# Patient Record
Sex: Male | Born: 1970 | Race: White | Hispanic: No | Marital: Married | Smoking: Never smoker
Health system: Southern US, Community
[De-identification: ages and names within clinical notes are randomized; demographics above are authoritative.]

## PROBLEM LIST (undated history)

## (undated) DIAGNOSIS — M1990 Unspecified osteoarthritis, unspecified site: Secondary | ICD-10-CM

## (undated) DIAGNOSIS — N201 Calculus of ureter: Secondary | ICD-10-CM

## (undated) DIAGNOSIS — G4733 Obstructive sleep apnea (adult) (pediatric): Secondary | ICD-10-CM

## (undated) DIAGNOSIS — N200 Calculus of kidney: Secondary | ICD-10-CM

## (undated) DIAGNOSIS — K219 Gastro-esophageal reflux disease without esophagitis: Secondary | ICD-10-CM

## (undated) DIAGNOSIS — R51 Headache: Secondary | ICD-10-CM

## (undated) DIAGNOSIS — Z8719 Personal history of other diseases of the digestive system: Secondary | ICD-10-CM

## (undated) HISTORY — PX: STRABISMUS SURGERY: SHX218

## (undated) HISTORY — DX: Calculus of kidney: N20.0

## (undated) HISTORY — DX: Gastro-esophageal reflux disease without esophagitis: K21.9

## (undated) HISTORY — DX: Unspecified osteoarthritis, unspecified site: M19.90

---

## 2002-03-10 ENCOUNTER — Emergency Department (HOSPITAL_COMMUNITY): Admission: EM | Admit: 2002-03-10 | Discharge: 2002-03-10 | Payer: Self-pay | Admitting: Oral Surgery

## 2003-08-16 ENCOUNTER — Ambulatory Visit (HOSPITAL_COMMUNITY): Admission: RE | Admit: 2003-08-16 | Discharge: 2003-08-16 | Payer: Self-pay | Admitting: Family Medicine

## 2008-04-14 ENCOUNTER — Ambulatory Visit (HOSPITAL_BASED_OUTPATIENT_CLINIC_OR_DEPARTMENT_OTHER): Admission: RE | Admit: 2008-04-14 | Discharge: 2008-04-14 | Payer: Self-pay | Admitting: Otolaryngology

## 2008-04-17 ENCOUNTER — Ambulatory Visit: Payer: Self-pay | Admitting: Internal Medicine

## 2008-11-30 ENCOUNTER — Emergency Department (HOSPITAL_COMMUNITY): Admission: EM | Admit: 2008-11-30 | Discharge: 2008-11-30 | Payer: Self-pay | Admitting: Emergency Medicine

## 2010-05-08 ENCOUNTER — Ambulatory Visit (INDEPENDENT_AMBULATORY_CARE_PROVIDER_SITE_OTHER): Payer: 59 | Admitting: Internal Medicine

## 2010-05-08 ENCOUNTER — Encounter: Payer: Self-pay | Admitting: Internal Medicine

## 2010-05-08 VITALS — BP 118/72 | HR 76 | Ht 70.0 in | Wt 240.0 lb

## 2010-05-08 DIAGNOSIS — R109 Unspecified abdominal pain: Secondary | ICD-10-CM

## 2010-05-08 DIAGNOSIS — R195 Other fecal abnormalities: Secondary | ICD-10-CM

## 2010-05-08 DIAGNOSIS — K219 Gastro-esophageal reflux disease without esophagitis: Secondary | ICD-10-CM | POA: Insufficient documentation

## 2010-05-08 DIAGNOSIS — R1310 Dysphagia, unspecified: Secondary | ICD-10-CM | POA: Insufficient documentation

## 2010-05-08 DIAGNOSIS — K920 Hematemesis: Secondary | ICD-10-CM | POA: Insufficient documentation

## 2010-05-08 DIAGNOSIS — R1010 Upper abdominal pain, unspecified: Secondary | ICD-10-CM | POA: Insufficient documentation

## 2010-05-08 NOTE — Assessment & Plan Note (Signed)
A repeat EGD this could be related to medication or perhaps noncompliance with medication. I don't think there is a strong indication for any type of endoscopic workup of the colon at this point.

## 2010-05-08 NOTE — Assessment & Plan Note (Signed)
Prior esophageal dilation about 10 years ago. He still has dysphagia about once a month. He has been noncompliant with PPI lately. We'll plan on upper endoscopy with possible soft tissue dilation. Risks benefits and indications explained.

## 2010-05-08 NOTE — Patient Instructions (Signed)
Your EGD is scheduled on 05/15/2010 at 10am Continue Nexium We will send for your records from [facility]  Upper GI Endoscopy Upper GI endoscopy means using a flexible scope to look at the esophagus, stomach and upper small bowel. This is done to make a diagnosis in people with heartburn, abdominal pain, or abnormal bleeding. Sometimes an endoscope is needed to remove foreign bodies or food that become stuck in the esophagus; it can also be used to take biopsy samples. For the best results, do not eat or drink for 8 hours before having your upper endoscopy.  To perform the endoscopy, you will probably be sedated and your throat will be numbed with a special spray. The endoscope is then slowly passed down your throat (this will not interfere with your breathing). An endoscopy exam takes 15-30 minutes to complete and there is no real pain. Patients rarely remember much about the procedure. The results of the test may take several days if a biopsy or other test is taken.  You may have a sore throat after an endoscopy exam. Serious complications are very rare. Stick to liquids and soft foods until your pain is better. You should not drive a car or operate any dangerous equipment for at least 24 hours after being sedated. SEEK IMMEDIATE MEDICAL CARE IF:  You have severe throat pain.   You have shortness of breath.   You have bleeding problems.   You have a fever.   You have difficulty recovering from your sedation.  Document Released: 01/26/2004 Document Re-Released: 03/14/2009 [facility] Patient Information 2011 [place], [place].

## 2010-05-08 NOTE — Progress Notes (Signed)
  Subjective:    Patient ID: Bruce Stevenson, male    DOB: 19-Apr-1970, 40 y.o.   MRN: [id number]  HPI 40 yo white married man with history of what sounds like GERD and esophageal dilation. He does not recall details and says his wife handles his medical affairs usually. 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 Was on Prilosec and then Nexium.lately for the past year he has used it only intermittently as reflux symptoms were less. Now with two episodes of intense nocturnal abdominal pain, awakens, initially unable to pass gas, defecate. Pain is upper for most part. Has had sweats, felt like he was going to pass out. Cold compresses on forehead helped. Eventually passed a loose stool, nauseated and took an ent-emetic his wife had. This was 3 weeks ago. Another similar episode last week. Vomited with some streaks of red blood. Has been mostly ok since but did have upper abdomnal discomfort. He went to an urgent care [facility]) was told they would call if anything abnormal. He describes dysphagia with solids and liquids about once a month, he waits and it passes. Sensation in suprasternal area. Thinks he has irritable bowel as he has some loose stools, especially in last year. 1 large cup of tea in AM but no other significant caffeine. Weight has been stable overall.   Review of Systems Allergies, dyspnea, insomnia All other ROS negative    Objective:   Physical Exam General: Well-developed, well-nourished and in no acute distress. Mildly obese Vitals: Reviewed and listed above Eyes:anicteric, PERRL Mouth and posterior pharynx: normal.  Neck: supple w/o thyromegaly or mass.  Lungs: clear. Heart: S1S2, no rubs, murmurs, gallops. Abdomen: soft, non-tender, no hepatosplenomegaly, hernia, or mass and BS+.  Lymphatics: no cervical or [place]  nodes. Extremities:  no edema Neuro: nonfocal. A&O x 3.  Psych: appropriate mood and  affect.        Assessment & Plan:

## 2010-05-08 NOTE — Assessment & Plan Note (Signed)
Minor hematemesis with second episode of upper abdominal pain and vomiting. EGD will help understand this. Sounds like he has GERD, this or stricture irritation may be the cause, question peptic ulcer disease.

## 2010-05-08 NOTE — Assessment & Plan Note (Signed)
2 episodes of this. We're assuming the lab workup at the urgent care was normal but I will get back to see. He needs an upper endoscopy to investigate. If that is unrevealing an abdominal ultrasound to look for gallstones could be needed versus CT scanning. Upper GI luminal etiologies, gallstones, pancreatic seemed like possible sources at this point.

## 2010-05-12 ENCOUNTER — Encounter: Payer: Self-pay | Admitting: Internal Medicine

## 2010-05-15 ENCOUNTER — Ambulatory Visit (AMBULATORY_SURGERY_CENTER): Payer: 59 | Admitting: Internal Medicine

## 2010-05-15 ENCOUNTER — Encounter: Payer: Self-pay | Admitting: Internal Medicine

## 2010-05-15 VITALS — BP 148/101 | HR 72 | Temp 98.0°F | Resp 16 | Ht 70.0 in | Wt 240.0 lb

## 2010-05-15 DIAGNOSIS — K219 Gastro-esophageal reflux disease without esophagitis: Secondary | ICD-10-CM

## 2010-05-15 DIAGNOSIS — K2970 Gastritis, unspecified, without bleeding: Secondary | ICD-10-CM

## 2010-05-15 DIAGNOSIS — K920 Hematemesis: Secondary | ICD-10-CM

## 2010-05-15 DIAGNOSIS — R109 Unspecified abdominal pain: Secondary | ICD-10-CM

## 2010-05-15 DIAGNOSIS — R1010 Upper abdominal pain, unspecified: Secondary | ICD-10-CM

## 2010-05-15 DIAGNOSIS — R1310 Dysphagia, unspecified: Secondary | ICD-10-CM

## 2010-05-15 DIAGNOSIS — K2990 Gastroduodenitis, unspecified, without bleeding: Secondary | ICD-10-CM

## 2010-05-15 HISTORY — PX: ESOPHAGOGASTRODUODENOSCOPY: SHX1529

## 2010-05-15 MED ORDER — SODIUM CHLORIDE 0.9 % IV SOLN: 500.0000 mL | INTRAVENOUS | Status: DC

## 2010-05-15 NOTE — Patient Instructions (Addendum)
[facility] and [employer] reviewed with patient and carepartner. Blue Discharge sheet signed by carepartner.  Gastitis handout given.  There was gastritis or inflammation of the stomach lining seen at endoscopy today. The esophagus looked ok but it sounds like you have GERD (acid reflux). Stay on the Nexium. I think you could stop the sucralfate (carafate) or stop when you finish current prescription. Call Dr. Marvell Fuller office to make a follow-up appointment for 3 months. We may be able to reduce dose of Nexium then. Iva Boop, MD, [facility]

## 2010-05-16 ENCOUNTER — Telehealth: Payer: Self-pay

## 2010-05-16 NOTE — Procedures (Signed)
NAME:  Bruce Stevenson, Bruce Stevenson                ACCOUNT NO.:  [id number]   MEDICAL RECORD NO.:  [id number]          PATIENT TYPE:  OUT   LOCATION:  SLEEP CENTER                 FACILITY:  [facility]   PHYSICIAN:  Clinton D. Maple Hudson, MD, FCCP, FACPDATE OF BIRTH:  11/04/69   DATE OF STUDY:  04/14/2008                            NOCTURNAL POLYSOMNOGRAM   REFERRING PHYSICIAN:  Zola Button T. Lazarus Salines, M.D.   INDICATION FOR STUDY:  Insomnia with sleep apnea.   EPWORTH SLEEPINESS SCORE:  Epworth sleepiness score 1/24, BMI 32.8,  weight 235 pounds, height 71 inches, and neck 16 inches.   HOME MEDICATION:  Charted and reviewed.   SLEEP ARCHITECTURE:  Total sleep time 343 minutes with sleep efficiency  91.2%.  Stage I was 8.7%, stage II 66.4%, stage III 15.6%, REM 9.3% of  total sleep time.  Sleep latency 6 minutes, REM latency 84 minutes,  awake after sleep onset 27 minutes, arousal index 29.5.  No bedtime  medication was taken.   RESPIRATORY DATA:  Apnea/hypopnea index (AHI) 3.7 per hour.  A total of  21 events meeting standard apnea/hypopnea criteria were scored including  3 obstructive apneas and 18 hypopneas.  Events were more common while  supine.  REM AHI 13.1 per hour.  An additional 124 respiratory events  related to arousals were counted.  These do not meet duration or  desaturation for criteria to be scored as full apneas or hypopneas but  recognized as causing sleep disturbance and gave a total respiratory  disturbance index (RDI) of 25.3 per hour.  There were insufficient  apneas and hypopneas to permit CPAP titration by split protocol on the  study night.   OXYGEN DATA:  Moderate snoring with oxygen desaturation to a nadir of  85%.  Mean oxygen saturation through the study was 94.6% on room air.   CARDIAC DATA:  Normal sinus rhythm.   MOVEMENT/PARASOMNIA:  No significant movement disturbance.  No bathroom  trips.   IMPRESSION/RECOMMENDATIONS:  1. Moderate obstructive sleep apnea/hypopnea  syndrome based on      respiratory disturbance index, 25.3 per hour.  This represented a      significant number of events not quite meeting duration and oxygen      desaturation criteria to be scored as apneas and hypopneas.  The      apnea/hypopnea index (AHI) was 3.7 per hour.  The RDI can be used      as a basis for initiating treatment as appropriate.  There was      moderate snoring with oxygen desaturation to a nadir of 85%.  2. Based on the RDI, consider return for CPAP titration if      appropriate.  The apnea/hypopnea index by itself did not provide      enough events to permit initiation of standard CPAP split protocol      titration on the study night.      Clinton D. Maple Hudson, MD, [facility], FACP  Diplomate, Biomedical engineer of Sleep Medicine  Electronically Signed     CDY/MEDQ  D:  04/17/2008 12:21:28  T:  04/18/2008 03:07:32  Job:  [id number]

## 2010-05-16 NOTE — Telephone Encounter (Signed)
I left message on the pt's home answering machine, ID on recording.  Pt to call if any questions or concerns.  MAW

## 2010-07-27 ENCOUNTER — Ambulatory Visit: Payer: 59 | Admitting: Internal Medicine

## 2010-11-28 ENCOUNTER — Telehealth: Payer: Self-pay | Admitting: Internal Medicine

## 2010-11-28 NOTE — Telephone Encounter (Signed)
I spoke with the patient's wife.  He has gone to the ER or urgent care.  He is c/o the above symptoms and the CP and SOB was worsening.  She will have him call back for an appt if the ER MD feels the symptoms are all GI related.

## 2010-11-28 NOTE — Telephone Encounter (Signed)
Agree - and will need ED records sent if not in epic

## 2012-09-09 ENCOUNTER — Ambulatory Visit (HOSPITAL_COMMUNITY)
Admission: RE | Admit: 2012-09-09 | Discharge: 2012-09-09 | Disposition: A | Discharge status: Home / Self Care | Payer: 59 | Source: Ambulatory Visit | Attending: Urology | Admitting: Urology

## 2012-09-09 ENCOUNTER — Ambulatory Visit (INDEPENDENT_AMBULATORY_CARE_PROVIDER_SITE_OTHER): Payer: 59 | Admitting: Urology

## 2012-09-09 ENCOUNTER — Other Ambulatory Visit: Payer: Self-pay | Admitting: Urology

## 2012-09-09 DIAGNOSIS — N201 Calculus of ureter: Secondary | ICD-10-CM | POA: Insufficient documentation

## 2012-09-09 DIAGNOSIS — N200 Calculus of kidney: Secondary | ICD-10-CM

## 2013-05-21 ENCOUNTER — Encounter (HOSPITAL_COMMUNITY): Payer: Self-pay | Admitting: Pharmacy Technician

## 2013-05-21 ENCOUNTER — Other Ambulatory Visit: Payer: Self-pay | Admitting: Neurological Surgery

## 2013-05-27 NOTE — Pre-Procedure Instructions (Signed)
Bruce Stevenson  05/27/2013   Your procedure is scheduled on:  Mon, June 1 @ 11:00 AM  Report to [facility]  at 8:00 AM.  Call this number if you have problems the morning of surgery: [phone]   Remember:   Do not eat food or drink liquids after midnight.   Take these medicines the morning of surgery with A SIP OF WATER: Esomeprazole(Nexium),Pain Pill(if needed),and Tamsulosin(Flomax)              Stop taking your Diclofenac and Ibuprofen. No Goody's,BC's,Aleve,Fish Oil,or any Herbal Medications   Do not wear jewelry  Do not wear lotions, powders, or colognes. You may wear deodorant.  Men may shave face and neck.  Do not bring valuables to the hospital.  [facility] is not responsible                  for any belongings or valuables.               Contacts, dentures or bridgework may not be worn into surgery.  Leave suitcase in the car. After surgery it may be brought to your room.  For patients admitted to the hospital, discharge time is determined by your                treatment team.               Patients discharged the day of surgery will not be allowed to drive  home.    Special Instructions:  [facility] - Preparing for Surgery  Before surgery, you can play an important role.  Because skin is not sterile, your skin needs to be as free of germs as possible.  You can reduce the number of germs on you skin by washing with CHG (chlorahexidine gluconate) soap before surgery.  CHG is an antiseptic cleaner which kills germs and bonds with the skin to continue killing germs even after washing.  Please DO NOT use if you have an allergy to CHG or antibacterial soaps.  If your skin becomes reddened/irritated stop using the CHG and inform your nurse when you arrive at Short Stay.  Do not shave (including legs and underarms) for at least 48 hours prior to the first CHG shower.  You may shave your face.  Please follow these instructions carefully:   1.  Shower with CHG Soap  the night before surgery and the                                morning of Surgery.  2.  If you choose to wash your hair, wash your hair first as usual with your       normal shampoo.  3.  After you shampoo, rinse your hair and body thoroughly to remove the                      Shampoo.  4.  Use CHG as you would any other liquid soap.  You can apply chg directly       to the skin and wash gently with scrungie or a clean washcloth.  5.  Apply the CHG Soap to your body ONLY FROM THE NECK DOWN.        Do not use on open wounds or open sores.  Avoid contact with your eyes,       ears, mouth and genitals (private parts).  Wash genitals (private parts)       with your normal soap.  6.  Wash thoroughly, paying special attention to the area where your surgery        will be performed.  7.  Thoroughly rinse your body with warm water from the neck down.  8.  DO NOT shower/wash with your normal soap after using and rinsing off       the CHG Soap.  9.  Pat yourself dry with a clean towel.            10.  Wear clean pajamas.            11.  Place clean sheets on your bed the night of your first shower and do not        sleep with pets.  Day of Surgery  Do not apply any lotions/deoderants the morning of surgery.  Please wear clean clothes to the hospital/surgery center.     Please read over the following fact sheets that you were given: Pain Booklet, Coughing and Deep Breathing, MRSA Information and Surgical Site Infection Prevention

## 2013-05-28 ENCOUNTER — Inpatient Hospital Stay (HOSPITAL_COMMUNITY)
Admission: RE | Admit: 2013-05-28 | Discharge: 2013-05-28 | Disposition: A | Discharge status: Home / Self Care | Payer: 59 | Source: Ambulatory Visit

## 2013-05-29 ENCOUNTER — Encounter (HOSPITAL_COMMUNITY)
Admission: RE | Admit: 2013-05-29 | Discharge: 2013-05-29 | Disposition: A | Discharge status: Home / Self Care | Payer: 59 | Source: Ambulatory Visit | Attending: Neurological Surgery | Admitting: Neurological Surgery

## 2013-05-29 ENCOUNTER — Encounter (HOSPITAL_COMMUNITY): Payer: Self-pay

## 2013-05-29 HISTORY — DX: Headache: R51

## 2013-05-29 LAB — CBC
HCT: 42.1 % (ref 39.0–52.0)
Hemoglobin: 15.1 g/dL (ref 13.0–17.0)
MCH: 30.5 pg (ref 26.0–34.0)
MCHC: 35.9 g/dL (ref 30.0–36.0)
MCV: 85.1 fL (ref 78.0–100.0)
PLATELETS: 261 10*3/uL (ref 150–400)
RBC: 4.95 MIL/uL (ref 4.22–5.81)
RDW: 13.1 % (ref 11.5–15.5)
WBC: 8.1 10*3/uL (ref 4.0–10.5)

## 2013-05-29 LAB — SURGICAL PCR SCREEN
MRSA, PCR: NEGATIVE
STAPHYLOCOCCUS AUREUS: POSITIVE — AB

## 2013-05-29 NOTE — Progress Notes (Signed)
Primary physician - does not have - uses walk in clinic  No  Cardiac testing

## 2013-05-31 MED ORDER — CEFAZOLIN SODIUM-DEXTROSE 2-3 GM-% IV SOLR
2.0000 g | INTRAVENOUS | Status: AC
Start: 2013-06-01 — End: 2013-06-01
  Administered 2013-06-01: 2 g via INTRAVENOUS
  Filled 2013-05-31: qty 50

## 2013-06-01 ENCOUNTER — Observation Stay (HOSPITAL_COMMUNITY)
Admission: RE | Admit: 2013-06-01 | Discharge: 2013-06-02 | Disposition: A | Discharge status: Home / Self Care | Payer: 59 | Source: Ambulatory Visit | Attending: Neurological Surgery | Admitting: Neurological Surgery

## 2013-06-01 ENCOUNTER — Encounter (HOSPITAL_COMMUNITY)
Admission: RE | Disposition: A | Discharge status: Home / Self Care | Payer: Self-pay | Source: Ambulatory Visit | Attending: Neurological Surgery

## 2013-06-01 ENCOUNTER — Encounter (HOSPITAL_COMMUNITY): Payer: Self-pay | Admitting: Anesthesiology

## 2013-06-01 ENCOUNTER — Inpatient Hospital Stay (HOSPITAL_COMMUNITY): Payer: 59 | Admitting: Anesthesiology

## 2013-06-01 ENCOUNTER — Encounter (HOSPITAL_COMMUNITY): Payer: 59 | Admitting: Anesthesiology

## 2013-06-01 ENCOUNTER — Inpatient Hospital Stay (HOSPITAL_COMMUNITY): Payer: 59

## 2013-06-01 DIAGNOSIS — G4730 Sleep apnea, unspecified: Secondary | ICD-10-CM | POA: Insufficient documentation

## 2013-06-01 DIAGNOSIS — F3289 Other specified depressive episodes: Secondary | ICD-10-CM | POA: Insufficient documentation

## 2013-06-01 DIAGNOSIS — M1990 Unspecified osteoarthritis, unspecified site: Secondary | ICD-10-CM | POA: Insufficient documentation

## 2013-06-01 DIAGNOSIS — Z01812 Encounter for preprocedural laboratory examination: Secondary | ICD-10-CM | POA: Insufficient documentation

## 2013-06-01 DIAGNOSIS — F411 Generalized anxiety disorder: Secondary | ICD-10-CM | POA: Insufficient documentation

## 2013-06-01 DIAGNOSIS — F329 Major depressive disorder, single episode, unspecified: Secondary | ICD-10-CM | POA: Insufficient documentation

## 2013-06-01 DIAGNOSIS — K219 Gastro-esophageal reflux disease without esophagitis: Secondary | ICD-10-CM | POA: Insufficient documentation

## 2013-06-01 DIAGNOSIS — D3610 Benign neoplasm of peripheral nerves and autonomic nervous system, unspecified: Secondary | ICD-10-CM | POA: Diagnosis present

## 2013-06-01 DIAGNOSIS — IMO0002 Reserved for concepts with insufficient information to code with codable children: Secondary | ICD-10-CM | POA: Insufficient documentation

## 2013-06-01 DIAGNOSIS — D321 Benign neoplasm of spinal meninges: Principal | ICD-10-CM | POA: Insufficient documentation

## 2013-06-01 HISTORY — PX: LAMINECTOMY: SHX219

## 2013-06-01 SURGERY — LUMBAR LAMINECTOMY FOR TUMOR
Anesthesia: General

## 2013-06-01 MED ORDER — PHENOL 1.4 % MT LIQD: 1.0000 | OROMUCOSAL | Status: DC | PRN

## 2013-06-01 MED ORDER — VECURONIUM BROMIDE 10 MG IV SOLR
INTRAVENOUS | Status: DC | PRN
  Administered 2013-06-01: 2 mg via INTRAVENOUS
  Administered 2013-06-01 (×3): 1 mg via INTRAVENOUS

## 2013-06-01 MED ORDER — METHOCARBAMOL 1000 MG/10ML IJ SOLN
500.0000 mg | Freq: Four times a day (QID) | INTRAMUSCULAR | Status: DC | PRN
  Filled 2013-06-01: qty 5

## 2013-06-01 MED ORDER — FENTANYL CITRATE 0.05 MG/ML IJ SOLN
INTRAMUSCULAR | Status: AC
  Filled 2013-06-01: qty 5

## 2013-06-01 MED ORDER — HYDROMORPHONE HCL PF 1 MG/ML IJ SOLN
0.2500 mg | INTRAMUSCULAR | Status: DC | PRN
  Administered 2013-06-01: 0.5 mg via INTRAVENOUS

## 2013-06-01 MED ORDER — MENTHOL 3 MG MT LOZG: 1.0000 | LOZENGE | OROMUCOSAL | Status: DC | PRN

## 2013-06-01 MED ORDER — OXYCODONE HCL 5 MG/5ML PO SOLN: 5.0000 mg | Freq: Once | ORAL | Status: DC | PRN

## 2013-06-01 MED ORDER — FENTANYL CITRATE 0.05 MG/ML IJ SOLN
INTRAMUSCULAR | Status: DC | PRN
  Administered 2013-06-01: 150 ug via INTRAVENOUS
  Administered 2013-06-01 (×2): 50 ug via INTRAVENOUS
  Administered 2013-06-01: 100 ug via INTRAVENOUS

## 2013-06-01 MED ORDER — SODIUM CHLORIDE 0.9 % IR SOLN
Status: DC | PRN
  Administered 2013-06-01: 12:00:00

## 2013-06-01 MED ORDER — POLYETHYLENE GLYCOL 3350 17 G PO PACK
17.0000 g | PACK | Freq: Every day | ORAL | Status: DC | PRN
  Filled 2013-06-01: qty 1

## 2013-06-01 MED ORDER — LACTATED RINGERS IV SOLN
INTRAVENOUS | Status: DC
  Administered 2013-06-01: 09:00:00 via INTRAVENOUS

## 2013-06-01 MED ORDER — NEOSTIGMINE METHYLSULFATE 10 MG/10ML IV SOLN
INTRAVENOUS | Status: AC
  Filled 2013-06-01: qty 2

## 2013-06-01 MED ORDER — PROPOFOL 10 MG/ML IV BOLUS
INTRAVENOUS | Status: AC
  Filled 2013-06-01: qty 20

## 2013-06-01 MED ORDER — SODIUM CHLORIDE 0.9 % IV SOLN: INTRAVENOUS | Status: DC

## 2013-06-01 MED ORDER — LIDOCAINE-EPINEPHRINE 1 %-1:100000 IJ SOLN
INTRAMUSCULAR | Status: DC | PRN
  Administered 2013-06-01: 5 mL

## 2013-06-01 MED ORDER — THROMBIN 20000 UNITS EX SOLR
CUTANEOUS | Status: DC | PRN
  Administered 2013-06-01: 12:00:00 via TOPICAL

## 2013-06-01 MED ORDER — GLYCOPYRROLATE 0.2 MG/ML IJ SOLN
INTRAMUSCULAR | Status: DC | PRN
  Administered 2013-06-01: .6 mg via INTRAVENOUS

## 2013-06-01 MED ORDER — METOCLOPRAMIDE HCL 5 MG/ML IJ SOLN
10.0000 mg | Freq: Once | INTRAMUSCULAR | Status: AC | PRN
  Administered 2013-06-01: 10 mg via INTRAVENOUS

## 2013-06-01 MED ORDER — DEXTROSE 5 % IV SOLN
10.0000 mg | INTRAVENOUS | Status: DC | PRN
  Administered 2013-06-01: 20 ug/min via INTRAVENOUS

## 2013-06-01 MED ORDER — ALUM & MAG HYDROXIDE-SIMETH 200-200-20 MG/5ML PO SUSP: 30.0000 mL | Freq: Four times a day (QID) | ORAL | Status: DC | PRN

## 2013-06-01 MED ORDER — LIDOCAINE HCL (CARDIAC) 20 MG/ML IV SOLN
INTRAVENOUS | Status: DC | PRN
  Administered 2013-06-01: 100 mg via INTRAVENOUS

## 2013-06-01 MED ORDER — ROCURONIUM BROMIDE 50 MG/5ML IV SOLN
INTRAVENOUS | Status: AC
  Filled 2013-06-01: qty 1

## 2013-06-01 MED ORDER — NEOSTIGMINE METHYLSULFATE 10 MG/10ML IV SOLN
INTRAVENOUS | Status: DC | PRN
  Administered 2013-06-01: 4 mg via INTRAVENOUS

## 2013-06-01 MED ORDER — 0.9 % SODIUM CHLORIDE (POUR BTL) OPTIME
TOPICAL | Status: DC | PRN
  Administered 2013-06-01: 1000 mL

## 2013-06-01 MED ORDER — LACTATED RINGERS IV SOLN
INTRAVENOUS | Status: DC | PRN
  Administered 2013-06-01 (×3): via INTRAVENOUS

## 2013-06-01 MED ORDER — ACETAMINOPHEN 325 MG PO TABS: 650.0000 mg | ORAL_TABLET | ORAL | Status: DC | PRN

## 2013-06-01 MED ORDER — MUPIROCIN 2 % EX OINT: 1.0000 "application " | TOPICAL_OINTMENT | Freq: Two times a day (BID) | CUTANEOUS | Status: DC

## 2013-06-01 MED ORDER — ARTIFICIAL TEARS OP OINT
TOPICAL_OINTMENT | OPHTHALMIC | Status: DC | PRN
  Administered 2013-06-01: 1 via OPHTHALMIC

## 2013-06-01 MED ORDER — ACETAMINOPHEN 650 MG RE SUPP: 650.0000 mg | RECTAL | Status: DC | PRN

## 2013-06-01 MED ORDER — TAMSULOSIN HCL 0.4 MG PO CAPS
0.4000 mg | ORAL_CAPSULE | Freq: Every day | ORAL | Status: DC
  Administered 2013-06-02: 0.4 mg via ORAL
  Filled 2013-06-01: qty 1

## 2013-06-01 MED ORDER — MIDAZOLAM HCL 5 MG/5ML IJ SOLN
INTRAMUSCULAR | Status: DC | PRN
  Administered 2013-06-01: 2 mg via INTRAVENOUS

## 2013-06-01 MED ORDER — ROCURONIUM BROMIDE 100 MG/10ML IV SOLN
INTRAVENOUS | Status: DC | PRN
  Administered 2013-06-01: 50 mg via INTRAVENOUS

## 2013-06-01 MED ORDER — ARTIFICIAL TEARS OP OINT
TOPICAL_OINTMENT | OPHTHALMIC | Status: AC
  Filled 2013-06-01: qty 3.5

## 2013-06-01 MED ORDER — SODIUM CHLORIDE 0.9 % IJ SOLN: 3.0000 mL | INTRAMUSCULAR | Status: DC | PRN

## 2013-06-01 MED ORDER — PANTOPRAZOLE SODIUM 40 MG PO TBEC
40.0000 mg | DELAYED_RELEASE_TABLET | Freq: Every day | ORAL | Status: DC
  Administered 2013-06-02: 40 mg via ORAL
  Filled 2013-06-01: qty 1

## 2013-06-01 MED ORDER — METHOCARBAMOL 500 MG PO TABS
ORAL_TABLET | ORAL | Status: AC
  Filled 2013-06-01: qty 1

## 2013-06-01 MED ORDER — BISACODYL 10 MG RE SUPP: 10.0000 mg | Freq: Every day | RECTAL | Status: DC | PRN

## 2013-06-01 MED ORDER — DEXAMETHASONE SODIUM PHOSPHATE 4 MG/ML IJ SOLN
INTRAMUSCULAR | Status: DC | PRN
  Administered 2013-06-01: 8 mg via INTRAVENOUS

## 2013-06-01 MED ORDER — PHENYLEPHRINE 40 MCG/ML (10ML) SYRINGE FOR IV PUSH (FOR BLOOD PRESSURE SUPPORT)
PREFILLED_SYRINGE | INTRAVENOUS | Status: AC
  Filled 2013-06-01: qty 10

## 2013-06-01 MED ORDER — METHOCARBAMOL 500 MG PO TABS
500.0000 mg | ORAL_TABLET | Freq: Four times a day (QID) | ORAL | Status: DC | PRN
  Administered 2013-06-01 (×2): 500 mg via ORAL
  Filled 2013-06-01: qty 1

## 2013-06-01 MED ORDER — MUPIROCIN 2 % EX OINT
1.0000 "application " | TOPICAL_OINTMENT | Freq: Two times a day (BID) | CUTANEOUS | Status: DC
  Administered 2013-06-01: 1 via NASAL

## 2013-06-01 MED ORDER — METOCLOPRAMIDE HCL 5 MG/ML IJ SOLN
INTRAMUSCULAR | Status: AC
  Filled 2013-06-01: qty 2

## 2013-06-01 MED ORDER — PHENYLEPHRINE HCL 10 MG/ML IJ SOLN
INTRAMUSCULAR | Status: DC | PRN
  Administered 2013-06-01 (×4): 40 ug via INTRAVENOUS

## 2013-06-01 MED ORDER — ONDANSETRON HCL 4 MG/2ML IJ SOLN: 4.0000 mg | INTRAMUSCULAR | Status: DC | PRN

## 2013-06-01 MED ORDER — DEXAMETHASONE SODIUM PHOSPHATE 4 MG/ML IJ SOLN
INTRAMUSCULAR | Status: AC
  Filled 2013-06-01: qty 2

## 2013-06-01 MED ORDER — OXYCODONE-ACETAMINOPHEN 5-325 MG PO TABS: 1.0000 | ORAL_TABLET | ORAL | Status: DC | PRN

## 2013-06-01 MED ORDER — SODIUM CHLORIDE 0.9 % IJ SOLN
3.0000 mL | Freq: Two times a day (BID) | INTRAMUSCULAR | Status: DC
  Administered 2013-06-01: 3 mL via INTRAVENOUS

## 2013-06-01 MED ORDER — ONDANSETRON HCL 4 MG/2ML IJ SOLN
INTRAMUSCULAR | Status: AC
  Filled 2013-06-01: qty 2

## 2013-06-01 MED ORDER — SODIUM CHLORIDE 0.9 % IV SOLN: 250.0000 mL | INTRAVENOUS | Status: DC

## 2013-06-01 MED ORDER — ONDANSETRON HCL 4 MG/2ML IJ SOLN
INTRAMUSCULAR | Status: DC | PRN
  Administered 2013-06-01: 4 mg via INTRAVENOUS

## 2013-06-01 MED ORDER — THROMBIN 5000 UNITS EX SOLR
OROMUCOSAL | Status: DC | PRN
  Administered 2013-06-01: 12:00:00 via TOPICAL

## 2013-06-01 MED ORDER — MIDAZOLAM HCL 2 MG/2ML IJ SOLN
INTRAMUSCULAR | Status: AC
  Filled 2013-06-01: qty 2

## 2013-06-01 MED ORDER — LIDOCAINE HCL (CARDIAC) 20 MG/ML IV SOLN
INTRAVENOUS | Status: AC
  Filled 2013-06-01: qty 5

## 2013-06-01 MED ORDER — HYDROMORPHONE HCL PF 1 MG/ML IJ SOLN
INTRAMUSCULAR | Status: AC
  Filled 2013-06-01: qty 1

## 2013-06-01 MED ORDER — OXYCODONE-ACETAMINOPHEN 5-325 MG PO TABS
1.0000 | ORAL_TABLET | ORAL | Status: DC | PRN
  Administered 2013-06-01 – 2013-06-02 (×2): 2 via ORAL
  Filled 2013-06-01 (×2): qty 2

## 2013-06-01 MED ORDER — SENNA 8.6 MG PO TABS
1.0000 | ORAL_TABLET | Freq: Two times a day (BID) | ORAL | Status: DC
  Administered 2013-06-01 – 2013-06-02 (×2): 8.6 mg via ORAL
  Filled 2013-06-01 (×3): qty 1

## 2013-06-01 MED ORDER — BUPIVACAINE HCL 0.5 % IJ SOLN
INTRAMUSCULAR | Status: DC | PRN
  Administered 2013-06-01: 5 mL
  Administered 2013-06-01: 20 mL

## 2013-06-01 MED ORDER — GLYCOPYRROLATE 0.2 MG/ML IJ SOLN
INTRAMUSCULAR | Status: AC
  Filled 2013-06-01: qty 4

## 2013-06-01 MED ORDER — DOCUSATE SODIUM 100 MG PO CAPS
100.0000 mg | ORAL_CAPSULE | Freq: Two times a day (BID) | ORAL | Status: DC
  Administered 2013-06-01 – 2013-06-02 (×2): 100 mg via ORAL
  Filled 2013-06-01 (×3): qty 1

## 2013-06-01 MED ORDER — MORPHINE SULFATE 2 MG/ML IJ SOLN: 1.0000 mg | INTRAMUSCULAR | Status: DC | PRN

## 2013-06-01 MED ORDER — OXYCODONE HCL 5 MG PO TABS: 5.0000 mg | ORAL_TABLET | Freq: Once | ORAL | Status: DC | PRN

## 2013-06-01 MED ORDER — VECURONIUM BROMIDE 10 MG IV SOLR
INTRAVENOUS | Status: AC
  Filled 2013-06-01: qty 10

## 2013-06-01 MED ORDER — PROPOFOL 10 MG/ML IV BOLUS
INTRAVENOUS | Status: DC | PRN
  Administered 2013-06-01: 200 mg via INTRAVENOUS

## 2013-06-01 SURGICAL SUPPLY — 81 items
ADH SKN CLS LQ APL DERMABOND (GAUZE/BANDAGES/DRESSINGS) ×1
APL SKNCLS STERI-STRIP NONHPOA (GAUZE/BANDAGES/DRESSINGS) ×1
BAG DECANTER FOR FLEXI CONT (MISCELLANEOUS) ×3 IMPLANT
BENZOIN TINCTURE PRP APPL 2/3 (GAUZE/BANDAGES/DRESSINGS) ×2 IMPLANT
BLADE 10 SAFETY STRL DISP (BLADE) ×1 IMPLANT
BLADE SURG 11 STRL SS (BLADE) ×4 IMPLANT
BLADE SURG ROTATE 9660 (MISCELLANEOUS) ×3 IMPLANT
BUR MATCHSTICK NEURO 3.0 LAGG (BURR) ×3 IMPLANT
CANISTER SUCT 3000ML (MISCELLANEOUS) ×3 IMPLANT
CATH ROBINSON RED A/P 10FR (CATHETERS) IMPLANT
CLIP TI MEDIUM 6 (CLIP) IMPLANT
CLOSURE WOUND 1/2 X4 (GAUZE/BANDAGES/DRESSINGS)
CONT SPEC 4OZ CLIKSEAL STRL BL (MISCELLANEOUS) ×5 IMPLANT
DECANTER SPIKE VIAL GLASS SM (MISCELLANEOUS) ×3 IMPLANT
DERMABOND ADHESIVE PROPEN (GAUZE/BANDAGES/DRESSINGS) ×2
DERMABOND ADVANCED .7 DNX6 (GAUZE/BANDAGES/DRESSINGS) IMPLANT
DRAPE LAPAROTOMY 100X72 PEDS (DRAPES) ×2 IMPLANT
DRAPE LAPAROTOMY 100X72X124 (DRAPES) ×3 IMPLANT
DRAPE LONG LASER MIC (DRAPES) IMPLANT
DRAPE MICROSCOPE LEICA (MISCELLANEOUS) ×2 IMPLANT
DRAPE POUCH INSTRU U-SHP 10X18 (DRAPES) ×3 IMPLANT
DRAPE PROXIMA HALF (DRAPES) ×4 IMPLANT
DURAPREP 26ML APPLICATOR (WOUND CARE) ×3 IMPLANT
ELECT REM PT RETURN 9FT ADLT (ELECTROSURGICAL) ×3
ELECTRODE REM PT RTRN 9FT ADLT (ELECTROSURGICAL) ×1 IMPLANT
GAUZE SPONGE 4X4 16PLY XRAY LF (GAUZE/BANDAGES/DRESSINGS) IMPLANT
GLOVE BIO SURGEON STRL SZ7.5 (GLOVE) IMPLANT
GLOVE BIOGEL PI IND STRL 7.0 (GLOVE) IMPLANT
GLOVE BIOGEL PI IND STRL 7.5 (GLOVE) IMPLANT
GLOVE BIOGEL PI IND STRL 8.5 (GLOVE) ×1 IMPLANT
GLOVE BIOGEL PI INDICATOR 7.0 (GLOVE) ×2
GLOVE BIOGEL PI INDICATOR 7.5 (GLOVE) ×4
GLOVE BIOGEL PI INDICATOR 8.5 (GLOVE) ×4
GLOVE ECLIPSE 8.0 STRL XLNG CF (GLOVE) ×8 IMPLANT
GLOVE ECLIPSE 8.5 STRL (GLOVE) ×7 IMPLANT
GLOVE EXAM NITRILE LRG STRL (GLOVE) IMPLANT
GLOVE EXAM NITRILE MD LF STRL (GLOVE) IMPLANT
GLOVE EXAM NITRILE XL STR (GLOVE) IMPLANT
GLOVE EXAM NITRILE XS STR PU (GLOVE) IMPLANT
GLOVE SURG SS PI 7.0 STRL IVOR (GLOVE) ×2 IMPLANT
GOWN STRL REUS W/ TWL LRG LVL3 (GOWN DISPOSABLE) IMPLANT
GOWN STRL REUS W/ TWL XL LVL3 (GOWN DISPOSABLE) ×1 IMPLANT
GOWN STRL REUS W/TWL 2XL LVL3 (GOWN DISPOSABLE) ×7 IMPLANT
GOWN STRL REUS W/TWL LRG LVL3 (GOWN DISPOSABLE)
GOWN STRL REUS W/TWL XL LVL3 (GOWN DISPOSABLE) ×6
HEMOSTAT SURGICEL 2X14 (HEMOSTASIS) ×3 IMPLANT
KIT BASIN OR (CUSTOM PROCEDURE TRAY) ×3 IMPLANT
KIT ROOM TURNOVER OR (KITS) ×3 IMPLANT
NDL SPNL 18GX3.5 QUINCKE PK (NEEDLE) IMPLANT
NEEDLE HYPO 22GX1.5 SAFETY (NEEDLE) ×3 IMPLANT
NEEDLE SPNL 18GX3.5 QUINCKE PK (NEEDLE) ×3 IMPLANT
NS IRRIG 1000ML POUR BTL (IV SOLUTION) ×3 IMPLANT
PACK LAMINECTOMY NEURO (CUSTOM PROCEDURE TRAY) IMPLANT
PAD ARMBOARD 7.5X6 YLW CONV (MISCELLANEOUS) ×9 IMPLANT
PATTIES SURGICAL .25X.25 (GAUZE/BANDAGES/DRESSINGS) IMPLANT
PATTIES SURGICAL .5 X1 (DISPOSABLE) ×2 IMPLANT
PATTIES SURGICAL .5 X3 (DISPOSABLE) IMPLANT
PATTIES SURGICAL 1/4 X 3 (GAUZE/BANDAGES/DRESSINGS) IMPLANT
RUBBERBAND STERILE (MISCELLANEOUS) IMPLANT
SPECIMEN JAR SMALL (MISCELLANEOUS) IMPLANT
SPONGE GAUZE 4X4 12PLY (GAUZE/BANDAGES/DRESSINGS) IMPLANT
SPONGE LAP 4X18 X RAY DECT (DISPOSABLE) IMPLANT
SPONGE SURGIFOAM ABS GEL 100 (HEMOSTASIS) ×3 IMPLANT
STAPLER SKIN PROX WIDE 3.9 (STAPLE) ×3 IMPLANT
STRIP CLOSURE SKIN 1/2X4 (GAUZE/BANDAGES/DRESSINGS) IMPLANT
SUT NURALON 4 0 TR CR/8 (SUTURE) ×2 IMPLANT
SUT PDS AB 1 CTX 36 (SUTURE) IMPLANT
SUT PROLENE 6 0 BV (SUTURE) ×8 IMPLANT
SUT SILK 3 0 TIES 17X18 (SUTURE)
SUT SILK 3-0 18XBRD TIE BLK (SUTURE) IMPLANT
SUT VIC AB 1 CT1 18XBRD ANBCTR (SUTURE) ×1 IMPLANT
SUT VIC AB 1 CT1 8-18 (SUTURE) ×6
SUT VIC AB 2-0 CP2 18 (SUTURE) ×5 IMPLANT
SUT VIC AB 3-0 SH 8-18 (SUTURE) ×2 IMPLANT
SYR 20ML ECCENTRIC (SYRINGE) ×3 IMPLANT
SYR CONTROL 10ML LL (SYRINGE) ×3 IMPLANT
TIP SONASTAR STD MISONIX 1.9 (TRAY / TRAY PROCEDURE) IMPLANT
TOWEL OR 17X24 6PK STRL BLUE (TOWEL DISPOSABLE) ×3 IMPLANT
TOWEL OR 17X26 10 PK STRL BLUE (TOWEL DISPOSABLE) ×3 IMPLANT
TRAY FOLEY CATH 16FRSI W/METER (SET/KITS/TRAYS/PACK) ×2 IMPLANT
WATER STERILE IRR 1000ML POUR (IV SOLUTION) ×3 IMPLANT

## 2013-06-01 NOTE — Anesthesia Preprocedure Evaluation (Addendum)
Anesthesia Evaluation  Patient identified by MRN, date of birth, ID band Patient awake    Reviewed: Allergy & Precautions, H&P , NPO status , Patient's Chart, lab work & pertinent test results, reviewed documented beta blocker date and time   Airway Mallampati: II TM Distance: >3 FB Neck ROM: full    Dental  (+) Dental Advisory Given, Teeth Intact   Pulmonary sleep apnea ,  breath sounds clear to auscultation        Cardiovascular negative cardio ROS  Rhythm:regular     Neuro/Psych  Headaches, PSYCHIATRIC DISORDERS Anxiety Depression  Neuromuscular disease negative psych ROS   GI/Hepatic Neg liver ROS, GERD-  Medicated and Controlled,  Endo/Other  Morbid obesity  Renal/GU Renal disease  negative genitourinary   Musculoskeletal  (+) Arthritis -, Osteoarthritis,    Abdominal   Peds  Hematology negative hematology ROS (+)   Anesthesia Other Findings See surgeon's H&P   Reproductive/Obstetrics negative OB ROS                         Anesthesia Physical Anesthesia Plan  ASA: III  Anesthesia Plan: General   Post-op Pain Management:    Induction: Intravenous  Airway Management Planned: Oral ETT  Additional Equipment:   Intra-op Plan:   Post-operative Plan: Extubation in OR  Informed Consent: I have reviewed the patients History and Physical, chart, labs and discussed the procedure including the risks, benefits and alternatives for the proposed anesthesia with the patient or authorized representative who has indicated his/her understanding and acceptance.   Dental Advisory Given  Plan Discussed with: CRNA and Surgeon  Anesthesia Plan Comments:         Anesthesia Quick Evaluation

## 2013-06-01 NOTE — Anesthesia Postprocedure Evaluation (Signed)
Anesthesia Post Note  Patient: Bruce Stevenson  Procedure(s) Performed: Procedure(s) (LRB): Lumbar Two to Three Laminectomy for resection of spinal tumor with operating microscope (N/A)  Anesthesia type: General  Patient location: PACU  Post pain: Pain level controlled  Post assessment: Patient's Cardiovascular Status Stable  Last Vitals:  Filed Vitals:   06/01/13 1445  BP: 93/42  Pulse: 65  Temp:   Resp: 10    Post vital signs: Reviewed and stable  Level of consciousness: alert  Complications: No apparent anesthesia complications

## 2013-06-01 NOTE — Anesthesia Procedure Notes (Signed)
Procedure Name: Intubation Date/Time: 06/01/2013 11:19 AM Performed by: Blair Heys E Pre-anesthesia Checklist: Patient identified, Emergency Drugs available, Suction available and Patient being monitored Patient Re-evaluated:Patient Re-evaluated prior to inductionOxygen Delivery Method: Circle system utilized Preoxygenation: Pre-oxygenation with 100% oxygen Intubation Type: IV induction Ventilation: Mask ventilation without difficulty and Oral airway inserted - appropriate to patient size Laryngoscope Size: Sabra Heck and 2 Grade View: Grade I Tube type: Oral Tube size: 7.5 mm Number of attempts: 1 Airway Equipment and Method: Stylet and Oral airway Placement Confirmation: ETT inserted through vocal cords under direct vision,  positive ETCO2 and breath sounds checked- equal and bilateral Secured at: 23 cm Tube secured with: Tape Dental Injury: Teeth and Oropharynx as per pre-operative assessment

## 2013-06-01 NOTE — Transfer of Care (Signed)
Immediate Anesthesia Transfer of Care Note  Patient: Bruce Stevenson Friday  Procedure(s) Performed: Procedure(s) with comments: Lumbar Two to Three Laminectomy for resection of spinal tumor with operating microscope (N/A) - L2-3 Laminectomy for resection of spinal tumor with operating microscope  Patient Location: PACU  Anesthesia Type:General  Level of Consciousness: awake and alert   Airway & Oxygen Therapy: Patient Spontanous Breathing and Patient connected to nasal cannula oxygen  Post-op Assessment: Report given to PACU RN, Post -op Vital signs reviewed and stable and Patient moving all extremities X 4  Post vital signs: Reviewed and stable  Complications: No apparent anesthesia complications

## 2013-06-01 NOTE — H&P (Signed)
CHIEF COMPLAINT:                                          Pain in the lower extremities and the back, cyst on MRI.  HISTORY OF PRESENT ILLNESS:                     Mr. Bruce Stevenson is a 43 year old right-handed individual who tells me that for about three months now, he has had progressive pain in his back with pain and weakness in his lower extremities.  It has been getting progressively worse and he sought some treatment with chiropractic and also at about the same time, he was seen by Dr. Lynann Bologna.  He was given a steroid dose course, which he feels may have helped some, but now that he is off the dose course, he feels that symptoms may be returning.  He finds that he gets pain in his back.  It gets worse when he walks any distance.  He has not been able to sleep well, and today Oswestry Disability Index suggest a disability index of 32.  He had an MRI performed that demonstrates there is a lesion in the spinal canal at about L3 that is isodense with spinal fluid.  It is suspected by the radiologist that this represents a CSF cyst; however, my suspicion is that the more common thing that this may represent is an ependymoma.  Nonetheless, there is a space occupying lesion, that does cause some pressure on the surrounding nerve roots in the region of the cauda equina.  The rest of the spine appears quite healthy with normal alignment of the spine.  Normal maintenance of all the vertebral body heights.  REVIEW OF SYSTEMS:                                    Systems review is notable for some balance disturbance, nausea, kidney stones, leg pain on walking, arm weakness, leg weakness, back pain, arm pain, leg pain, joint pain and swelling, arthritis, and neck pain.  He has some disorientation on occasion also.  PAST MEDICAL HISTORY:                                    Current Medical Conditions:  Reveals his general health has been excellent.    Medications and Allergies:  Current medications include only  Nexium.  SOCIAL HISTORY:                                            Personal history reveals that he does not smoke or use alcohol and has had about 20-pound weight gain since the pain began.  Currently states his height and weight are 5 feet 10 inches and 255 pounds.  PHYSICAL EXAMINATION:                                His physical exam reveals that he stands straight and erect.  He walks without any obvious antalgia.  He has good strength in the proximal musculature of his  lower extremities in the iliopsoas and quadriceps.  Tibialis anterior and gastrocs are also normal as noted by toe and heel walking.  Deep tendon reflexes are 2+ in the patellae, trace in both Achilles.  Sensation is intact to vibration distally in the lower extremities.  Specifically, the patient has not noted any problems with bowel or bladder control at this time also.  Mr. Ozga had an MRI with gadolinium that was performed on 05/18/2013.  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  Tumor measures 12 x 13 mm in dimension, is ovoid and thus compress to the outside of the exiting nerve roots in this region.  IMPRESSION/PLAN:                             I demonstrated and review the MRI findings with him and his wife who accompanies him.  I have advised that he does need to have this excised from his spine.  This would require a laminectomy at L2 and L3.  We would open the dura and identify the nerve roots and then dissect them free from the mass itself and remove the entirety of the mass.  I believe that this represents a benign process.  I also believe that it may very well be attached or arising from the filum terminale.  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  The other possibility is the cystic schwannoma that would likely be arising from one of the individual nerve roots.  This will be determine at this time of surgery, but basically the dissection  would be to remove the lesion in total and then close the dura.  This would leave a defect where he had a laminectomy, but should not be a cause for concern for longterm.  I noted to Leeroy that the surgery can take anywhere from two to four hours typically to dissect and remove and close this lesion.  The big risks of the surgery of course is the potential for damage to any of the individual nerve roots.  Beyond that, we worry about the potential for spinal fluid leak.  All these things will need to be managed possibly with surgery if need be for spinal fluid leak and occasionally with spinal fluid leaks, patients are treated with prolonged bed rest for five to seven days.  Barring any of the potential for complications, I would hope that Khairi would be in the hospital for at most two days' time.  His recovery would take about six weeks' time and I would plan this time out of work.  It is possible that if he is doing well after the first three weeks, we could allow him to do some office days if need be, but on the whole, I prefer that he remain out of the workplace for a total of six weeks time.  The main risks as noted was spinal fluid leakage, potential for injury to nerve roots and beyond this we do have some concern about infection.  Nonetheless this lesion does need to be removed as it is expand tile and can ultimately lead to some nerve damage.  At this point,. Montague has been having mostly irritated phenomenon in the form of pain and discomfort.  I would hope that these symptoms should resolve themselves once the lesion is resected and this process has had a chance to heal.  We would like to plan the surgery at his earliest convenience as he has been having symptoms for a number of months.  He has not had any involvement of bowel or bladder control, but he does note that moving his bowels does tend to engender some increasing pain. He is admitted for surgery.

## 2013-06-01 NOTE — Op Note (Signed)
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 endotracheal Indications: Patient is a 43 year old individual is had back and right lower extremity pain is also experiencing modest weakness and MRI was performed demonstrating what was suspected as an intradural lesion at the level of L2-L3 was initially thought this air present a synovial cyst, however after the gadolinium enhanced scan was performed it was evident that this was a solid tumor. The lesion measured proximally 14 mm in length 13 mm in diameter. He's been advised regarding the need for surgical resection.  Procedure: Patient was brought to the operating supine on a stretcher, after the smooth induction of general endotracheal anesthesia, he was turned prone onto the operating table. The back was prepped with alcohol and DuraPrep and draped in a sterile fashion. A midline incision was created at the level of L2-L3 which was marked on the skin was radiographic confirmation using a spinal needle. The dissection was carried into the subperiosteal space and the facet lamina to expose lamina of L2 completely including the junction of L2-L3. Then after a second confirmatory radiograph was obtained a laminectomy of the complete spinous process and laminar arch of L2 was performed being careful to maintain the integrity of the facet joints at the L2-L3 space and maintain also the integrity of the pars. The superior portion of the lamina of L3 was also undercut substantially as this area corresponded with the inferior margin of the lesion. Bleeding edges from the bone  were obtained with some Gelfoam soaked thrombin that was impacted into the bone epidural space was checked for hemostasis also. Once this was verified then the operating microscope was brought into the field and the dura was opened in the midline using a 15 blade initially to score the dura and then a [facility] with a 15 blade opened cephalad and caudad the underlying arachnoid was opened relieving substantial spinal fluid. Then dissection to the right of the nerve roots identified a purplish pearlescent lesion that measured approximately 14 mm as described on the MRI. This was dissected from the surrounding nerve roots fairly easily. As the lesion was lifted it was apparent that there was a singular root that was attached to the lesion. On the undersurface one can see the exophytic outgrowth of the lesion from this route. Under high-power magnification then I opened the pia layer surrounding the tumor and dissected a number of the nerve roots away however there was apparent evidence of several nerve roots going into the lesion itself these were sacrificed with micro-scissor. Bleeding from a vascular pedicle that went into the tumor was controlled by bipolar cautery and some small pledgets of Gelfoam. The lesion was then removed en bloc. Irrigation around this yielded good decompression no evidence of any residual tumor and hemostasis along the path of the nerve root. Nerve root was replaced back into the dural opening the contents of the dura were noted to lay loosely in the canal and then the dura was closed with 6-0 Prolene in a running fashion from the midline cephalad and from the midline distally. Valsalva maneuver to 45 cm water yielded no leakage. At this point the microscope was removed, the wound was irrigated copiously with antibiotic irrigant solution, hemostasis and the  soft tissues was checked and verified, and then the lumbar dorsal fascia was closed with #1 Vicryl in an interrupted  fashion.  2-0 Vicryl was used in the subcutaneous tissues 20 cc of half percent Marcaine was injected into the fascia and 3-0 Vicryl is used to close the subcutaneous take her skin. Blood loss for the procedure was estimated at approximately 150 cc.

## 2013-06-01 NOTE — Evaluation (Signed)
Occupational Therapy Evaluation Patient Details Name: Bruce Stevenson MRN: [id number] DOB: 1970/08/18 Today's Date: 06/01/2013    History of Present Illness 43 y.o. s/p Lumbar Two to Three Laminectomy for resection of spinal tumor with operating microscope (N/A)   Clinical Impression   Pt presents with below problem list. Pt independent with ADLs, PTA. Feel pt will benefit from acute OT to increase independence prior to d/c. Wife present for education.    Follow Up Recommendations  No OT follow up;Supervision - Intermittent    Equipment Recommendations  3 in 1 bedside comode;Other (comment) (AE)    Recommendations for Other Services       Precautions / Restrictions Precautions Precautions: Back Precaution Booklet Issued: Yes (comment) Precaution Comments: Educated on precautions  Restrictions Weight Bearing Restrictions: No      Mobility Bed Mobility Overal bed mobility: Needs Assistance Bed Mobility: Rolling;Sidelying to Sit;Sit to Sidelying Rolling: Min guard Sidelying to sit: Min guard     Sit to sidelying: Min guard General bed mobility comments: Cues for technique.  Transfers Overall transfer level: Needs assistance   Transfers: Sit to/from Stand Sit to Stand: Min guard;Min assist         General transfer comment: Cues for technique.    Balance                                            ADL Overall ADL's : Needs assistance/impaired     Grooming: Set up;Supervision/safety;Sitting               Lower Body Dressing: Minimal assistance;With adaptive equipment;Sit to/from stand   Toilet Transfer: Min guard;Ambulation (bed)           Functional mobility during ADLs: Min guard General ADL Comments: Educated on use of AE for LB ADLs as well as could perform in supine position. Pt practiced with sockaid. Pt ambulated a few steps in room two different times-limited by dizziness/nausea. Educated on toilet aid for hygiene as pt  states he has to twist quite a bit to do this.      Vision                     Perception     Praxis      Pertinent Vitals/Pain Pain indicated but not rated. Repositioned.      Hand Dominance     Extremity/Trunk Assessment Upper Extremity Assessment Upper Extremity Assessment: Overall WFL for tasks assessed   Lower Extremity Assessment Lower Extremity Assessment: Defer to PT evaluation       Communication Communication Communication: No difficulties   Cognition Arousal/Alertness: Awake/alert Behavior During Therapy: WFL for tasks assessed/performed Overall Cognitive Status: Within Functional Limits for tasks assessed                     General Comments       Exercises       Shoulder Instructions      Home Living Family/patient expects to be discharged to:: Private residence Living Arrangements: Spouse/significant other Available Help at Discharge: Family;Available PRN/intermittently Type of Home: House Home Access: Stairs to enter Entrance Stairs-Number of Steps: 3 Entrance Stairs-Rails: None Home Layout: One level     Bathroom Shower/Tub: Teacher, early years/pre: Standard     Home Equipment:  (access to shower chair)  Prior Functioning/Environment Level of Independence: Independent             OT Diagnosis: Acute pain   OT Problem List: Decreased activity tolerance;Decreased knowledge of use of DME or AE;Decreased knowledge of precautions;Pain   OT Treatment/Interventions: Self-care/ADL training;DME and/or AE instruction;Therapeutic activities;Patient/family education;Balance training    OT Goals(Current goals can be found in the care plan section) Acute Rehab OT Goals Patient Stated Goal: not stated OT Goal Formulation: With patient Time For Goal Achievement: 06/08/13 Potential to Achieve Goals: Good ADL Goals Pt Will Perform Lower Body Dressing: with modified independence;sit to/from stand;with  adaptive equipment Pt Will Transfer to Toilet: with modified independence;ambulating Pt Will Perform Toileting - Clothing Manipulation and hygiene: with modified independence;sit to/from stand Pt Will Perform Tub/Shower Transfer: Tub transfer;with supervision;ambulating;shower seat;rolling walker  OT Frequency: Min 2X/week   Barriers to D/C:            Co-evaluation              End of Session Equipment Utilized During Treatment: Gait belt Nurse Communication: Mobility status  Activity Tolerance: Other (comment) (dizziness, pain, nausea) Patient left: in bed;with call bell/phone within reach;with family/visitor present   Time: 3903-0092 OT Time Calculation (min): 20 min Charges:  OT General Charges $OT Visit: 1 Procedure OT Evaluation $Initial OT Evaluation Tier I: 1 Procedure OT Treatments $Self Care/Home Management : 8-22 mins G-Codes: OT G-codes **NOT FOR INPATIENT CLASS** Functional Assessment Tool Used: clinical judgment Functional Limitation: Self care Self Care Current Status (Z3007): At least 1 percent but less than 20 percent impaired, limited or restricted Self Care Goal Status (M2263): 0 percent impaired, limited or restricted  Benito Mccreedy OTR/L [phone] 06/01/2013, 6:37 PM

## 2013-06-02 ENCOUNTER — Encounter (HOSPITAL_COMMUNITY): Payer: Self-pay | Admitting: Neurological Surgery

## 2013-06-02 MED ORDER — DIAZEPAM 5 MG PO TABS: 5.0000 mg | ORAL_TABLET | Freq: Four times a day (QID) | ORAL | Status: DC | PRN

## 2013-06-02 MED ORDER — OXYCODONE-ACETAMINOPHEN 5-325 MG PO TABS: 1.0000 | ORAL_TABLET | ORAL | Status: DC | PRN

## 2013-06-02 NOTE — Discharge Instructions (Signed)
Wound Care Leave incision open to air. You may shower. Do not scrub directly on incision.  Do not put any creams, lotions, or ointments on incision. Activity Walk each and every day, increasing distance each day. No lifting greater than 5 lbs.  Avoid excessive neck motion. No driving for 2 weeks; may ride as a passenger locally. Wear neck brace at all times except when showering.  If provided soft collar, may wear for comfort unless otherwise instructed. Diet Resume your normal diet.  Return to Work Will be discussed at you follow up appointment. Call Your Doctor If Any of These Occur Redness, drainage, or swelling at the wound.  Temperature greater than 101 degrees. Severe pain not relieved by pain medication. Increased difficulty swallowing. Incision starts to come apart. Follow Up Appt Call today for appointment in 4 weeks ([phone]) or for problems.  If you have any hardware placed in your spine, you will need an x-ray before your appointment.     Laminectomy - Laminotomy - Discectomy Your surgeon has decided that a laminectomy (entire lamina removal) or laminotomy (partial lamina removal) is the best treatment for your back problem. These procedures involve removal of bone to relieve pressure on nerve roots. It allows the surgeon access to parts of the spine where other problems are located. This could be an injured disc (the cartilage-like structures located between the bones of the back). In this surgery your surgeon removes a part of the boney arch that surrounds your spinal canal. This may be compressing nerve roots. In some cases, the surgeon will remove the disc and fuse (stick together) vertebral bodies (the bones of your back) to make the spine more stable. The type of procedure you will need is usually decided prior to surgery, however modifications may be necessary. The time in surgery depends on the findings in surgery and the procedure necessary to correct the  problems. DISCECTOMY For people with disc problems, the surgeon removes the portion of the disc that is causing the pressure on the nerve root. Some surgeons perform a micro (small) discectomy, which may require removal of only a small portion of the lamina. A disc nucleus (center) may also be removed either through a needle (percutaneous discectomy) or by injecting an enzyme called chymopapain into the disc. Chymopapain is an enzyme that dissolves the disc. For people with back instability, the surgeon fuses vertebrae that are next to each other with tiny pieces of bone. These are used as bone grafts on the facets, or between the vertebrae. When this heals, the bones will no longer be able to move. These bone chips are often taken from the pelvic bones. Bones and bone grafts grow into one unit, stabilizing the segments of the spinal column. LET YOUR CAREGIVER KNOW ABOUT:  Allergies.  Medicines taken including herbs, eyedrops, over-the-counter medicines, and creams.  Use of steroids (by mouth or creams).  Previous problems with anesthetics or numbing medicine.  Possibility of pregnancy, if this applies.  History of blood clots (thrombophlebitis).  History of bleeding or blood problems.  Previous surgery.  Other health problems. RISKS AND COMPLICATIONS Your caregiver will discuss possible risks and complications with you before surgery. In addition to the usual risks of anesthesia, other common risks and complications include:  Blood loss and replacement.  Temporary increase in pain due to surgery.  Uncorrected back pain.  Infection.  New nerve damage (tingling, numbness, and pain). BEFORE THE PROCEDURE  Stop smoking at least 1 week prior to surgery. This  lowers risk during surgery.  Your caregiver may advise that you stop taking certain medicines that may affect the outcome of the surgery and your ability to heal. For example, you may need to stop taking anti-inflammatories,  such as aspirin, because of possible bleeding problems. Other medicines may have interactions with anesthesia.  Tell your caregiver if you have been on steroids for long periods of time. Often, additional steroids are administered intravenously before and during the procedure to prevent complications.  You should be present 60 minutes prior to your procedure or as directed. AFTER THE PROCEDURE After surgery, you will be taken to the recovery area where a nurse will watch and check your progress. Generally, you will be allowed to go home within 1 week barring other problems. HOME CARE INSTRUCTIONS   Check the surgical cut (incision) twice a day for signs of infection. Some signs may include a bad smelling, greenish or yellowish discharge from the wound; increased pain or increased redness over the incision site; an opening of the incision; flu-like symptoms; or a temperature above 101.5 F (38.6 C).  Change your bandages in about 24 to 36 hours following surgery or as directed.  You may shower once the bandage is removed or as directed. Avoid bathtubs, swimming pools, and hot tubs for 3 weeks or until your incision has healed completely. If you have stitches (sutures) or staples they may be removed 2 to 3 weeks after surgery, or as directed by your caregiver.  Follow your caregiver's instructions for activities, exercises, and physical therapy.  Weight reduction may be beneficial if you are overweight.  Walking is permitted. You may use a treadmill without an incline. Cut down on activities if you have discomfort. You may also go up and down stairs as tolerated.  Do not lift anything heavier than 10 to 15 pounds. Avoid bending or twisting at the waist. Always bend your knees.  Maintain strength and range of motion as instructed.  No driving is permitted for 2 to 3 weeks, or as directed by your caregiver. You may be a passenger for 20 to 30 minute trips. Lying back in the passenger seat may  be more comfortable for you.  Limit your sitting to 20 to 30 minute intervals. You should lie down or walk in between sitting periods. There are no limitations for sitting in a recliner chair.  Only take over-the-counter or prescription medicines for pain, discomfort, or fever as directed by your caregiver. SEEK MEDICAL CARE IF:   There is increased bleeding (more than a small spot) from the wound.  You notice redness, swelling, or increasing pain in the wound.  Pus is coming from the wound.  An unexplained oral temperature above 102 F (38.9 C) develops.  You notice a bad smell coming from the wound or dressing. SEEK IMMEDIATE MEDICAL CARE IF:   You develop a rash.  You have difficulty breathing.  You have any allergic problems. Document Released: 12/16/1999 Document Revised: 03/12/2011 Document Reviewed: 10/14/2007 [facility] Patient Information 2014 [place].

## 2013-06-02 NOTE — Discharge Summary (Signed)
Physician Discharge Summary  Patient ID: Bruce Stevenson MRN: [id number] DOB/AGE: February 25, 1970 43 y.o.  Admit date: 06/01/2013 Discharge date: 06/02/2013  Admission Diagnoses: Intradural schwannoma L2-L3 with lumbar radiculopathy  Discharge Diagnoses: Intradural schwannoma L2-L3 with lumbar radiculopathy Active Problems:   Schwannoma   Discharged Condition: good  Hospital Course: Patient was admitted to undergo surgical decompression of a schwannoma noted at L2-L3. The resection went uneventfully.  Consults: None  Significant Diagnostic Studies: None  Treatments: surgery: Laminectomy L2 partial L3 gross total resection of intradural schwannoma. Operating microscope  Discharge Exam: Blood pressure 120/67, pulse 99, temperature 97 F (36.1 C), temperature source Oral, resp. rate 18, weight 118.389 kg (261 lb), SpO2 93.00%. Incision is clean and dry, motor function is intact in lower extremities. Station and gait are normal.  Disposition: Discharge home  Discharge Instructions   Call MD for:  redness, tenderness, or signs of infection (pain, swelling, redness, odor or green/yellow discharge around incision site)    Complete by:  As directed      Call MD for:  severe uncontrolled pain    Complete by:  As directed      Call MD for:  temperature >100.4    Complete by:  As directed      Diet - low sodium heart healthy    Complete by:  As directed      Discharge instructions    Complete by:  As directed   Okay to shower. Do not apply salves or appointments to incision. No heavy lifting with the upper extremities greater than 15 pounds. May resume driving when not requiring pain medication and patient feels comfortable with doing so.     Increase activity slowly    Complete by:  As directed             Medication List         ADVIL 200 MG tablet  Generic drug:  ibuprofen  Take 400 mg by mouth daily as needed for mild pain.     diazepam 5 MG tablet  Commonly known as:  VALIUM   Take 1 tablet (5 mg total) by mouth every 6 (six) hours as needed for muscle spasms.     diclofenac sodium 1 % Gel  Commonly known as:  VOLTAREN  Apply 2 g topically at bedtime.     esomeprazole 40 MG capsule  Commonly known as:  NEXIUM  Take 40 mg by mouth daily before breakfast.     oxyCODONE-acetaminophen 5-325 MG per tablet  Commonly known as:  PERCOCET/ROXICET  Take 1-2 tablets by mouth every 4 (four) hours as needed for severe pain.     oxyCODONE-acetaminophen 5-325 MG per tablet  Commonly known as:  PERCOCET/ROXICET  Take 1-2 tablets by mouth every 4 (four) hours as needed for moderate pain.     tamsulosin 0.4 MG Caps capsule  Commonly known as:  FLOMAX  Take 0.4 mg by mouth daily.         SignedKristeen Miss 06/02/2013, 11:44 AM

## 2013-06-02 NOTE — Progress Notes (Signed)
Pt doing well. Pt and wife given D/C instructions with Rx's. Pt verbalized understanding of teaching. Pt's IV was removed prior to D/C. Pt D/C'd home via wheelchair @ 1220 per MD order. Pt is stable @ D/C and has no other needs at this time. Holli Humbles, RN

## 2013-06-02 NOTE — Evaluation (Signed)
Physical Therapy Evaluation/ Discharge Patient Details Name: Bruce Stevenson MRN: [id number] DOB: 06-20-1970 Today's Date: 06/02/2013   History of Present Illness  43 y.o. s/p Lumbar Two to Three Laminectomy for resection of spinal tumor   Clinical Impression  Pt moving well and unable to recall precautions from OT yesterday. Educated pt for all precautions and restrictions with handout and teach back , pt able to demonstrate correct technique with transfers and gait. Pt without bil LE weakness or pain and localized pain at incision only. Family able to assist as needed without DME needs or further therapy at this time. Pt verbalized understanding of all education and agreeable to sign off.    Follow Up Recommendations No PT follow up    Equipment Recommendations  None recommended by PT    Recommendations for Other Services       Precautions / Restrictions Precautions Precautions: Back Precaution Booklet Issued: Yes (comment) Precaution Comments: Educated on precautions  Restrictions Weight Bearing Restrictions: No      Mobility  Bed Mobility Overal bed mobility: Modified Independent     Sidelying to sit: Modified independent (Device/Increase time)     Sit to sidelying: Modified independent (Device/Increase time)    Transfers Overall transfer level: Modified independent                  Ambulation/Gait Ambulation/Gait assistance: Modified independent (Device/Increase time) Ambulation Distance (Feet): 350 Feet Assistive device: None Gait Pattern/deviations: WFL(Within Functional Limits)        Stairs Stairs: Yes Stairs assistance: Modified independent (Device/Increase time) Stair Management: No rails;Step to pattern;Forwards Number of Stairs: 3    Wheelchair Mobility    Modified Rankin (Stroke Patients Only)       Balance Overall balance assessment: No apparent balance deficits (not formally assessed)                                            Pertinent Vitals/Pain 6/10 incisional pain, RN aware    Home Living Family/patient expects to be discharged to:: Private residence Living Arrangements: Spouse/significant other Available Help at Discharge: Family;Available PRN/intermittently Type of Home: House Home Access: Stairs to enter Entrance Stairs-Rails: None Entrance Stairs-Number of Steps: 3 Home Layout: One level Home Equipment: None (access to shower chair)      Prior Function Level of Independence: Independent               Hand Dominance        Extremity/Trunk Assessment   Upper Extremity Assessment: Overall WFL for tasks assessed           Lower Extremity Assessment: Overall WFL for tasks assessed      Cervical / Trunk Assessment: Normal  Communication   Communication: No difficulties  Cognition Arousal/Alertness: Awake/alert Behavior During Therapy: WFL for tasks assessed/performed Overall Cognitive Status: Within Functional Limits for tasks assessed                      General Comments      Exercises        Assessment/Plan    PT Assessment Patent does not need any further PT services  PT Diagnosis     PT Problem List    PT Treatment Interventions     PT Goals (Current goals can be found in the Care Plan section) Acute Rehab PT Goals PT Goal Formulation: No  goals set, d/c therapy    Frequency     Barriers to discharge        Co-evaluation               End of Session   Activity Tolerance: Patient tolerated treatment well Patient left: in chair;with call bell/phone within reach Nurse Communication: Mobility status;Patient requests pain meds    Functional Assessment Tool Used: clinical judgement Functional Limitation: Mobility: Walking and moving around Mobility: Walking and Moving Around Current Status [phone]): At least 1 percent but less than 20 percent impaired, limited or restricted Mobility: Walking and Moving Around Goal Status  [phone]): At least 1 percent but less than 20 percent impaired, limited or restricted Mobility: Walking and Moving Around Discharge Status [phone]): At least 1 percent but less than 20 percent impaired, limited or restricted    Time: 0810-0830 PT Time Calculation (min): 20 min   Charges:   PT Evaluation $Initial PT Evaluation Tier I: 1 Procedure PT Treatments $Therapeutic Activity: 8-22 mins   PT G Codes:   Functional Assessment Tool Used: clinical judgement Functional Limitation: Mobility: Walking and moving around    [employer] 06/02/2013, 8:43 AM Elwyn Reach, [place]

## 2013-06-02 NOTE — Progress Notes (Signed)
Occupational Therapy Treatment Patient Details Name: Bruce Stevenson MRN: [id number] DOB: August 13, 1970 Today's Date: 06/02/2013    History of present illness 43 y.o. s/p Lumbar Two to Three Laminectomy for resection of spinal tumor    OT comments  Pt moving well and no further OT needs. Wife present during session.  Follow Up Recommendations  No OT follow up;Supervision - Intermittent    Equipment Recommendations   (AE)    Recommendations for Other Services      Precautions / Restrictions Precautions Precautions: Back Precaution Booklet Issued: Yes (comment) Precaution Comments: Pt able to state 3/3 precautions Restrictions Weight Bearing Restrictions: No       Mobility    General bed mobility comments: pt in chair  Transfers Overall transfer level: Needs assistance Equipment used: None Transfers: Sit to/from Stand Sit to Stand: Modified independent (Device/Increase time);Supervision         General transfer comment: supervision for toilet transfer.    Balance Overall balance assessment: No apparent balance deficits (not formally assessed)                                 ADL Overall ADL's : Needs assistance/impaired                     Lower Body Dressing: Set up;Supervision/safety;Sitting/lateral leans (sock)   Toilet Transfer: Supervision/safety;Regular Toilet;Grab bars       Tub/ Shower Transfer: Supervision/safety (practiced stepping over)   Functional mobility during ADLs: Modified independent;Supervision/safety (supervision for tub transfer.) General ADL Comments: Recommended spouse be with him for tub transfer. Educated on safe shoewear, toilet aid for hygiene, and discussed safety with rugs and pets. Pt practiced with reacher and sockaid. Educated on use of cups for teeth care and placement of grooming items to avoid bending/twisting.      Vision                     Perception     Praxis      Cognition    Behavior During Therapy: [facility] for tasks assessed/performed Overall Cognitive Status: Within Functional Limits for tasks assessed                       Extremity/Trunk Assessment  Upper Extremity Assessment Upper Extremity Assessment: Overall WFL for tasks assessed   Lower Extremity Assessment Lower Extremity Assessment: Overall WFL for tasks assessed   Cervical / Trunk Assessment Cervical / Trunk Assessment: Normal    Exercises     Shoulder Instructions       General Comments      Pertinent Vitals/ Pain       Pain 3/10 at end of session. Repositioned pillow.  Home Living Family/patient expects to be discharged to:: Private residence Living Arrangements: Spouse/significant other Available Help at Discharge: Family;Available PRN/intermittently Type of Home: House Home Access: Stairs to enter [employer] of Steps: 3 Entrance Stairs-Rails: None Home Layout: One level               Home Equipment: None (access to shower chair)          Prior Functioning/Environment Level of Independence: Independent            Frequency Min 2X/week     Progress Toward Goals  OT Goals(current goals can now be found in the care plan section)  Progress towards OT goals: Progressing toward goals  Acute  Rehab OT Goals Patient Stated Goal: not stated OT Goal Formulation: With patient Time For Goal Achievement: 06/08/13 Potential to Achieve Goals: Good ADL Goals Pt Will Perform Lower Body Dressing: with modified independence;sit to/from stand;with adaptive equipment Pt Will Transfer to Toilet: with modified independence;ambulating Pt Will Perform Toileting - Clothing Manipulation and hygiene: with modified independence;sit to/from stand Pt Will Perform Tub/Shower Transfer: Tub transfer;with supervision;ambulating;shower seat;rolling walker  Plan Discharge plan remains appropriate    Co-evaluation                 End of Session Equipment  Utilized During Treatment: Gait belt   Activity Tolerance Patient tolerated treatment well   Patient Left in chair;with family/visitor present   Nurse Communication Other (comment) (pain level)        Time: 7482-7078 OT Time Calculation (min): 15 min  Charges: OT General Charges $OT Visit: 1 Procedure OT Treatments $Self Care/Home Management : 8-22 mins  Benito Mccreedy OTR/L [phone] 06/02/2013, 10:07 AM

## 2013-06-02 NOTE — Progress Notes (Signed)
Patient ID: Bruce Stevenson, male   DOB: 01/01/1971, 43 y.o.   MRN: [id number] Alert vital signs stable. Incision is clean and dry Motor function is intact in iliopsoas quadriceps tibialis anterior and gastrocs Patient has voided no complaints of nausea pain under reasonable control Discharge home

## 2015-04-01 IMAGING — CR DG LUMBAR SPINE 2-3V
1 series · 1 of 1 positions shown · non-contrast
Comparison: MRI lumbar spine 05/18/2013.

CLINICAL DATA: Intradural spinal lesion.  Intraoperative film.

EXAM:
LUMBAR SPINE - 2-3 VIEW

[lat]
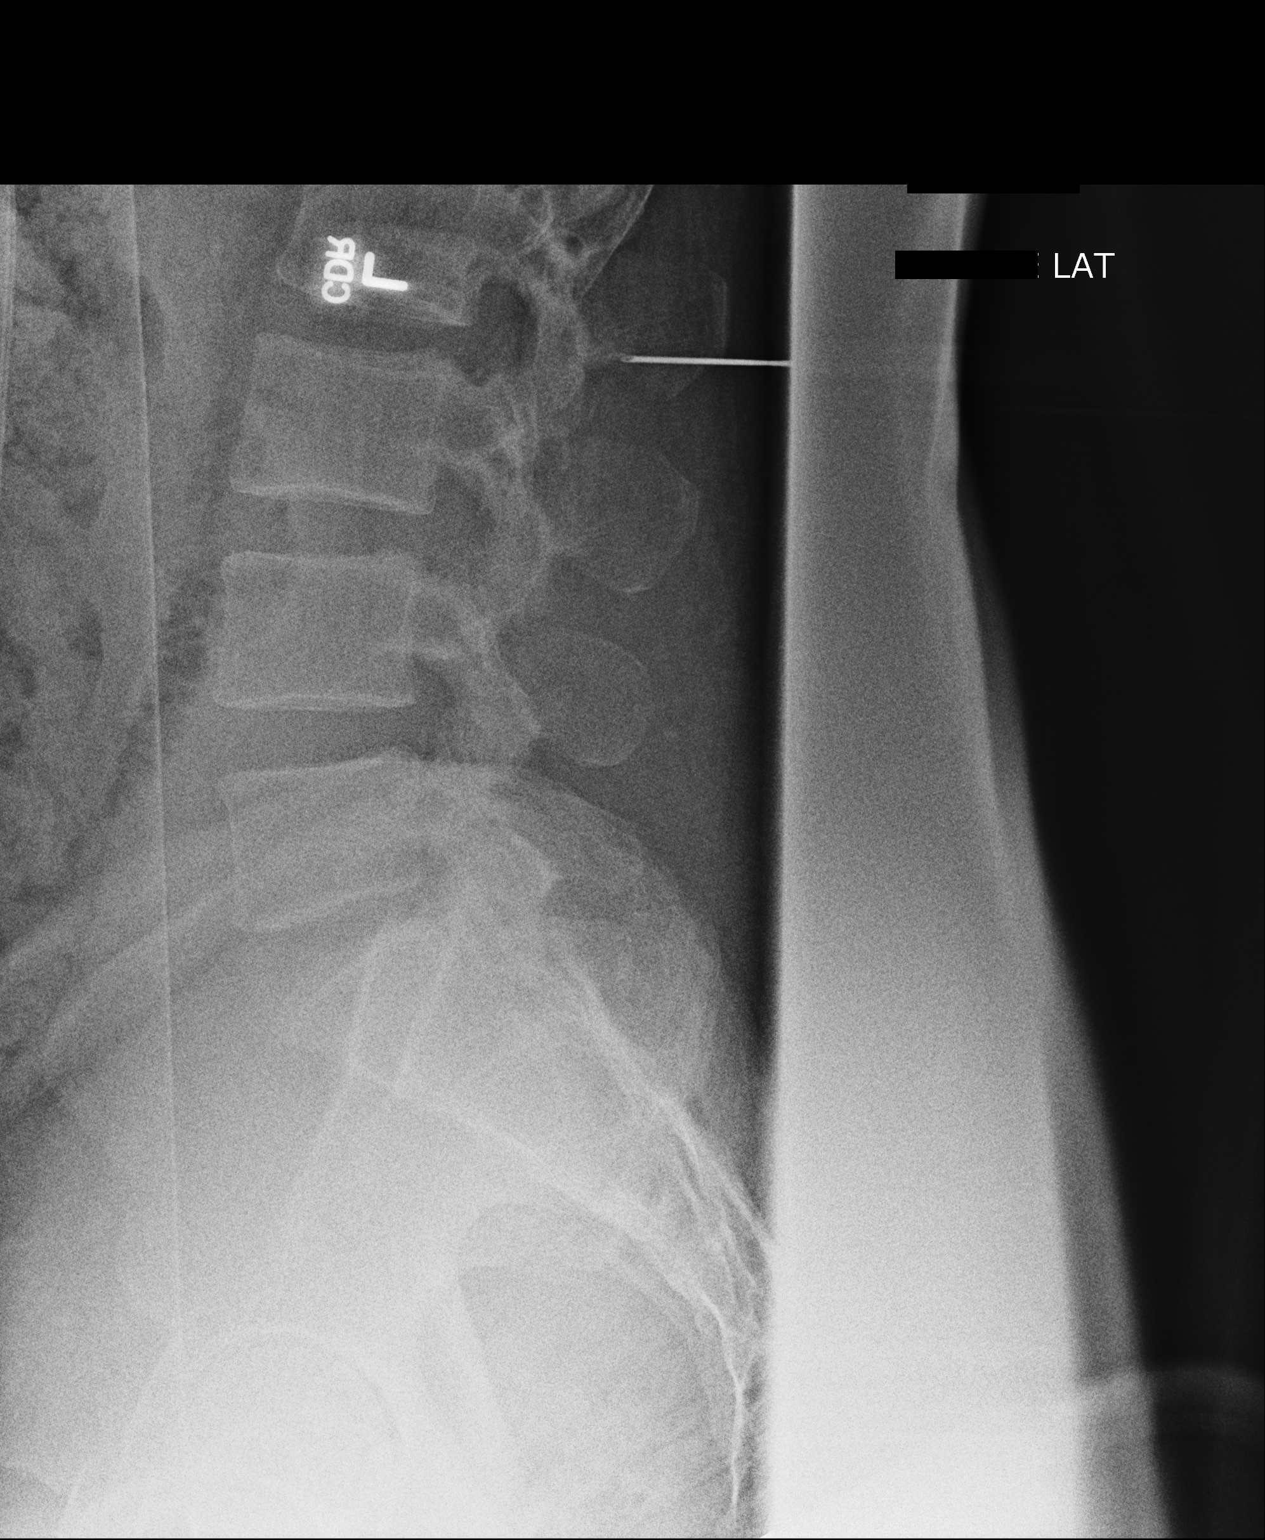

[1 of 1 positions shown; findings below may reference images not displayed]

FINDINGS: We are provided with 2 fluoroscopic intraoperative spot views of the
lumbar spine. The first image demonstrates a probe at the level of
the L2-3 interspace. The second image demonstrates probes at the
level of the L2 and L3 pedicles.
IMPRESSION: L2-3 localization.

## 2016-06-29 ENCOUNTER — Emergency Department (HOSPITAL_COMMUNITY)
Admission: EM | Admit: 2016-06-29 | Discharge: 2016-06-29 | Disposition: A | Discharge status: Home / Self Care | Payer: 59 | Attending: Emergency Medicine | Admitting: Emergency Medicine

## 2016-06-29 ENCOUNTER — Encounter (HOSPITAL_COMMUNITY): Payer: Self-pay | Admitting: Emergency Medicine

## 2016-06-29 ENCOUNTER — Emergency Department (HOSPITAL_COMMUNITY): Payer: 59

## 2016-06-29 DIAGNOSIS — Z79899 Other long term (current) drug therapy: Secondary | ICD-10-CM | POA: Insufficient documentation

## 2016-06-29 DIAGNOSIS — N201 Calculus of ureter: Secondary | ICD-10-CM | POA: Insufficient documentation

## 2016-06-29 DIAGNOSIS — R109 Unspecified abdominal pain: Secondary | ICD-10-CM | POA: Diagnosis present

## 2016-06-29 LAB — URINALYSIS, ROUTINE W REFLEX MICROSCOPIC
Bacteria, UA: NONE SEEN
Bilirubin Urine: NEGATIVE
Glucose, UA: NEGATIVE mg/dL
Ketones, ur: NEGATIVE mg/dL
Leukocytes, UA: NEGATIVE
NITRITE: NEGATIVE
Protein, ur: NEGATIVE mg/dL
SPECIFIC GRAVITY, URINE: 1.014 (ref 1.005–1.030)
Squamous Epithelial / LPF: NONE SEEN
pH: 5 (ref 5.0–8.0)

## 2016-06-29 LAB — CBC WITH DIFFERENTIAL/PLATELET
BASOS ABS: 0 10*3/uL (ref 0.0–0.1)
Basophils Relative: 0 %
Eosinophils Absolute: 0.2 10*3/uL (ref 0.0–0.7)
Eosinophils Relative: 1 %
HCT: 42.6 % (ref 39.0–52.0)
HEMOGLOBIN: 14.9 g/dL (ref 13.0–17.0)
Lymphocytes Relative: 21 %
Lymphs Abs: 2.5 10*3/uL (ref 0.7–4.0)
MCH: 29.4 pg (ref 26.0–34.0)
MCHC: 35 g/dL (ref 30.0–36.0)
MCV: 84.2 fL (ref 78.0–100.0)
MONO ABS: 1.3 10*3/uL — AB (ref 0.1–1.0)
Monocytes Relative: 11 %
Neutro Abs: 7.8 10*3/uL — ABNORMAL HIGH (ref 1.7–7.7)
Neutrophils Relative %: 67 %
Platelets: 327 10*3/uL (ref 150–400)
RBC: 5.06 MIL/uL (ref 4.22–5.81)
RDW: 12.6 % (ref 11.5–15.5)
WBC: 11.8 10*3/uL — AB (ref 4.0–10.5)

## 2016-06-29 LAB — BASIC METABOLIC PANEL
ANION GAP: 11 (ref 5–15)
BUN: 14 mg/dL (ref 6–20)
CHLORIDE: 101 mmol/L (ref 101–111)
CO2: 27 mmol/L (ref 22–32)
Calcium: 9.8 mg/dL (ref 8.9–10.3)
Creatinine, Ser: 1.72 mg/dL — ABNORMAL HIGH (ref 0.61–1.24)
GFR calc Af Amer: 53 mL/min — ABNORMAL LOW (ref >60)
GFR calc non Af Amer: 46 mL/min — ABNORMAL LOW (ref >60)
GLUCOSE: 93 mg/dL (ref 65–99)
POTASSIUM: 3.9 mmol/L (ref 3.5–5.1)
Sodium: 139 mmol/L (ref 135–145)

## 2016-06-29 MED ORDER — MAGNESIUM CITRATE PO SOLN: 1.0000 | Freq: Once | ORAL | 0 refills | Status: AC | PRN

## 2016-06-29 MED ORDER — ONDANSETRON HCL 4 MG/2ML IJ SOLN
4.0000 mg | Freq: Once | INTRAMUSCULAR | Status: AC
  Administered 2016-06-29: 4 mg via INTRAVENOUS
  Filled 2016-06-29: qty 2

## 2016-06-29 MED ORDER — HYDROMORPHONE HCL 1 MG/ML IJ SOLN
1.0000 mg | Freq: Once | INTRAMUSCULAR | Status: AC
  Administered 2016-06-29: 1 mg via INTRAVENOUS
  Filled 2016-06-29: qty 1

## 2016-06-29 MED ORDER — POLYETHYLENE GLYCOL 3350 17 GM/SCOOP PO POWD: 17.0000 g | Freq: Two times a day (BID) | ORAL | 0 refills | Status: AC

## 2016-06-29 MED ORDER — SODIUM CHLORIDE 0.9 % IV BOLUS (SEPSIS)
1000.0000 mL | Freq: Once | INTRAVENOUS | Status: AC
Start: 2016-06-29 — End: 2016-06-29
  Administered 2016-06-29: 1000 mL via INTRAVENOUS

## 2016-06-29 MED ORDER — OXYCODONE-ACETAMINOPHEN 5-325 MG PO TABS: 1.0000 | ORAL_TABLET | Freq: Four times a day (QID) | ORAL | 0 refills | Status: DC | PRN

## 2016-06-29 NOTE — ED Triage Notes (Signed)
Patient states that he saw a PA at [facility] and was told he had a 8 mm and wanted patient to pass it on his own. Patient is stating worse pain in his lower left flank. States history of kidney stones.

## 2016-06-29 NOTE — ED Notes (Signed)
Dr Viviana Simpler in to assess

## 2016-06-29 NOTE — ED Notes (Signed)
Patient transported to X-ray 

## 2016-06-29 NOTE — Discharge Instructions (Signed)
Your x-ray looks like you have some left sided kidney stones, the largest measuring 10 mm.  Please follow-up with your urologist on Monday. Do not eat or drink anything after midnight between Sunday to Monday in case they want to do a procedure.   Return without fail for worsening symptoms, including fever, intractable vomiting, escalating pain, or any other symptoms concerning to you. The best place to go is [facility], because the urologist has ability to perform procedures there if needed.

## 2016-06-29 NOTE — ED Provider Notes (Signed)
[facility] DEPT Provider Note   CSN: [id number] Arrival date & time: 06/29/16  1508     History   Chief Complaint Chief Complaint  Patient presents with  . Flank Pain    HPI LAZARIUS RIVKIN is a 46 y.o. male.  The history is provided by the patient.  Flank Pain  This is a new problem. The current episode started more than 1 week ago. The problem occurs constantly. The problem has been gradually worsening. Associated symptoms include abdominal pain. Pertinent negatives include no chest pain, no headaches and no shortness of breath. Nothing aggravates the symptoms. Nothing relieves the symptoms.   46 year old male who presents with left flank pain. History of recurrent nephrolithiasis. Reports 1 week of left-sided flank pain radiating to the left groin. I was seen by [facility] several days ago, and diagnosed with a 6-8 mm kidney stone. Was seen by a PA, and told to give it time to let it pass. Taking 2 Vicodin every 4-6 hours, with persistent worsening pain. Some intermittent nausea and vomiting. No fevers or chills. Mild dysuria. No significant hematuria or passage of blood clots. Past Medical History:  Diagnosis Date  . Anxiety   . Depression   . GERD (gastroesophageal reflux disease)   . Headache(784.0)   . Kidney stones   . Nephrolithiasis   . Osteoarthritis   . Sleep apnea    + sleep test, not on CPAP , he thinks nasal injury  the problem, has not followed up (Dr. Erik Obey - ENT_    Patient Active Problem List   Diagnosis Date Noted  . Schwannoma 06/01/2013  . Hematemesis 05/08/2010  . Dysphagia 05/08/2010  . Upper abdominal pain 05/08/2010  . Loose stools 05/08/2010  . GERD (gastroesophageal reflux disease) 05/08/2010    Past Surgical History:  Procedure Laterality Date  . ESOPHAGEAL DILATION     "esophageal narrowing", EGD approx 2002  . ESOPHAGOGASTRODUODENOSCOPY  05/15/2010   gastritis  . EYE SURGERY Left    muscular surgery  . LAMINECTOMY N/A  06/01/2013   Procedure: Lumbar Two to Three Laminectomy for resection of spinal tumor with operating microscope;  Surgeon: Kristeen Miss, MD;  Location: [place] [facility];  Service: Neurosurgery;  Laterality: N/A;  L2-3 Laminectomy for resection of spinal tumor with operating microscope       Home Medications    Prior to Admission medications   Medication Sig Start Date End Date Taking? Authorizing Provider  diazepam (VALIUM) 5 MG tablet Take 1 tablet (5 mg total) by mouth every 6 (six) hours as needed for muscle spasms. 06/02/13   Kristeen Miss, MD  diclofenac sodium (VOLTAREN) 1 % GEL Apply 2 g topically at bedtime.    [provider]  esomeprazole (NEXIUM) 40 MG capsule Take 40 mg by mouth daily before breakfast.     [provider]  ibuprofen (ADVIL) 200 MG tablet Take 400 mg by mouth daily as needed for mild pain.    [provider]  magnesium citrate SOLN Take 296 mLs (1 Bottle total) by mouth once as needed for severe constipation. 06/29/16   Forde Dandy, MD  oxyCODONE-acetaminophen (PERCOCET/ROXICET) 5-325 MG per tablet Take 1-2 tablets by mouth every 4 (four) hours as needed for severe pain.    [provider]  oxyCODONE-acetaminophen (PERCOCET/ROXICET) 5-325 MG per tablet Take 1-2 tablets by mouth every 4 (four) hours as needed for moderate pain. 06/02/13   Kristeen Miss, MD  oxyCODONE-acetaminophen (PERCOCET/ROXICET) 5-325 MG tablet Take 1-2 tablets  by mouth every 6 (six) hours as needed for moderate pain or severe pain. 06/29/16   Forde Dandy, MD  polyethylene glycol powder (GLYCOLAX/MIRALAX) powder Take 17 g by mouth 2 (two) times daily. 06/29/16   Forde Dandy, MD  tamsulosin (FLOMAX) 0.4 MG CAPS capsule Take 0.4 mg by mouth daily.    [provider]    Family History Family History  Problem Relation Age of Onset  . Ovarian cancer Mother   . Diabetes Mother   . Colon polyps Father 46    Social History Social History  Substance Use  Topics  . Smoking status: Never Smoker  . Smokeless tobacco: Never Used  . Alcohol use No     Allergies   Patient has no known allergies.   Review of Systems Review of Systems  Respiratory: Negative for shortness of breath.   Cardiovascular: Negative for chest pain.  Gastrointestinal: Positive for abdominal pain.  Genitourinary: Positive for flank pain.  Neurological: Negative for headaches.  All other systems reviewed and are negative.    Physical Exam Updated Vital Signs BP (!) 154/98   Pulse 82   Temp 98.1 F (36.7 C) (Oral)   Resp 18   Ht 5\' 10"  (1.778 m)   Wt 119.7 kg (264 lb)   SpO2 100%   BMI 37.88 kg/m   Physical Exam Physical Exam  Nursing note and vitals reviewed. Constitutional: Well developed, well nourished, non-toxic, and in no acute distress Head: Normocephalic and atraumatic.  Mouth/Throat: Oropharynx is clear and moist.  Neck: Normal range of motion. Neck supple.  Cardiovascular: Normal rate and regular rhythm.   Pulmonary/Chest: Effort normal and breath sounds normal.  Abdominal: Soft. There is no tenderness. There is no rebound and no guarding. Left CVA tenderness Musculoskeletal: Normal range of motion.  Neurological: Alert, no facial droop, fluent speech, moves all extremities symmetrically Skin: Skin is warm and dry.  Psychiatric: Cooperative   ED Treatments / Results  Labs (all labs ordered are listed, but only abnormal results are displayed) Labs Reviewed  URINALYSIS, ROUTINE W REFLEX MICROSCOPIC - Abnormal; Notable for the following:       Result Value   Hgb urine dipstick MODERATE (*)    All other components within normal limits  CBC WITH DIFFERENTIAL/PLATELET - Abnormal; Notable for the following:    WBC 11.8 (*)    Neutro Abs 7.8 (*)    Monocytes Absolute 1.3 (*)    All other components within normal limits  BASIC METABOLIC PANEL - Abnormal; Notable for the following:    Creatinine, Ser 1.72 (*)    GFR calc non Af Amer 46  (*)    GFR calc Af Amer 53 (*)    All other components within normal limits    EKG  EKG Interpretation None       Radiology Dg Abdomen 1 View  Result Date: 06/29/2016 CLINICAL DATA:  46 year old male with 1 week of progressive left flank pain. EXAM: ABDOMEN - 1 VIEW COMPARISON:  06/25/2016 [facility], and earlier. FINDINGS: A cluster of stones together measuring up to 20 mm projects at at the level of the proximal left ureter, at the L3-L4 level. These are slightly distal to their position on 06/25/2016 (L3 at that time). Numerous superimposed left nephrolithiasis which is otherwise grossly stable since 2016. Similar multiple calculi projecting over the right renal shadow today are partially obscured by proximal transverse colon. Stable bilateral pelvic phleboliths since 2016. Visualized bowel gas pattern is  non obstructed. No acute osseous abnormality identified. IMPRESSION: 1. Cluster of proximal left ureteral calculi, individually up to 10 mm, with minimal distal migration since 06/25/2016. Suspect associated left side obstructive uropathy. 2. Extensive bilateral nephrolithiasis. Electronically Signed   By: Genevie Ann M.D.   On: 06/29/2016 19:28    Procedures Procedures (including critical care time)  Medications Ordered in ED Medications  sodium chloride 0.9 % bolus 1,000 mL (1,000 mLs Intravenous New Bag/Given 06/29/16 1857)  HYDROmorphone (DILAUDID) injection 1 mg (1 mg Intravenous Given 06/29/16 1849)  ondansetron (ZOFRAN) injection 4 mg (4 mg Intravenous Given 06/29/16 1847)     Initial Impression / Assessment and Plan / ED Course  I have reviewed the triage vital signs and the nursing notes.  Pertinent labs & imaging results that were available during my care of the patient were reviewed by me and considered in my medical decision making (see chart for details).     Known left ureteral stone presents with worsening pain. He is nontoxic in no acute  distress, afebrile, hemodynamically stable. UA without signs of infection. X-ray does show probable multiple proximal ureteral stones on the left, with a maximum diameter of 10 mm. Pain improved with symptomatic management. With mild acute kidney injury with creatinine of 1.7, but unclear what his baseline is. Given clinical improvement he is felt to be stable for discharge with close urology follow-up on Monday. Did briefly discuss with Dr. Jeffie Pollock who recommended that patient be NPO at midnight on Sunday to anticipate procedure if needed on Monday. He recommended that he goes to [facility] ED if worsening symptoms. Strict return and follow-up instructions reviewed. He expressed understanding of all discharge instructions and felt comfortable with the plan of care.   Final Clinical Impressions(s) / ED Diagnoses   Final diagnoses:  Left ureteral stone    New Prescriptions New Prescriptions   MAGNESIUM CITRATE SOLN    Take 296 mLs (1 Bottle total) by mouth once as needed for severe constipation.   OXYCODONE-ACETAMINOPHEN (PERCOCET/ROXICET) 5-325 MG TABLET    Take 1-2 tablets by mouth every 6 (six) hours as needed for moderate pain or severe pain.   POLYETHYLENE GLYCOL POWDER (GLYCOLAX/MIRALAX) POWDER    Take 17 g by mouth 2 (two) times daily.     Forde Dandy, MD 06/29/16 2004

## 2016-07-02 ENCOUNTER — Other Ambulatory Visit: Payer: Self-pay | Admitting: Urology

## 2016-07-05 ENCOUNTER — Encounter (HOSPITAL_BASED_OUTPATIENT_CLINIC_OR_DEPARTMENT_OTHER): Payer: Self-pay | Admitting: *Deleted

## 2016-07-05 NOTE — Progress Notes (Signed)
NPO AFTER MN.  ARRIVE AT 1030.  NEEDS ISTAT 8.  WILL TAKE NEXIUM AND FLOMAX AM DOS W/ SIPS OF WATER.

## 2016-07-18 ENCOUNTER — Ambulatory Visit (HOSPITAL_BASED_OUTPATIENT_CLINIC_OR_DEPARTMENT_OTHER): Payer: 59 | Admitting: Anesthesiology

## 2016-07-18 ENCOUNTER — Encounter (HOSPITAL_BASED_OUTPATIENT_CLINIC_OR_DEPARTMENT_OTHER)
Admission: RE | Disposition: A | Discharge status: Home / Self Care | Payer: Self-pay | Source: Ambulatory Visit | Attending: Urology

## 2016-07-18 ENCOUNTER — Encounter (HOSPITAL_BASED_OUTPATIENT_CLINIC_OR_DEPARTMENT_OTHER): Payer: Self-pay | Admitting: Anesthesiology

## 2016-07-18 ENCOUNTER — Ambulatory Visit (HOSPITAL_BASED_OUTPATIENT_CLINIC_OR_DEPARTMENT_OTHER)
Admission: RE | Admit: 2016-07-18 | Discharge: 2016-07-18 | Disposition: A | Discharge status: Home / Self Care | Payer: 59 | Source: Ambulatory Visit | Attending: Urology | Admitting: Urology

## 2016-07-18 DIAGNOSIS — E669 Obesity, unspecified: Secondary | ICD-10-CM | POA: Insufficient documentation

## 2016-07-18 DIAGNOSIS — F419 Anxiety disorder, unspecified: Secondary | ICD-10-CM | POA: Insufficient documentation

## 2016-07-18 DIAGNOSIS — F329 Major depressive disorder, single episode, unspecified: Secondary | ICD-10-CM | POA: Insufficient documentation

## 2016-07-18 DIAGNOSIS — Z87442 Personal history of urinary calculi: Secondary | ICD-10-CM | POA: Diagnosis not present

## 2016-07-18 DIAGNOSIS — Z833 Family history of diabetes mellitus: Secondary | ICD-10-CM | POA: Insufficient documentation

## 2016-07-18 DIAGNOSIS — M1990 Unspecified osteoarthritis, unspecified site: Secondary | ICD-10-CM | POA: Diagnosis not present

## 2016-07-18 DIAGNOSIS — Z8371 Family history of colonic polyps: Secondary | ICD-10-CM | POA: Insufficient documentation

## 2016-07-18 DIAGNOSIS — N202 Calculus of kidney with calculus of ureter: Secondary | ICD-10-CM | POA: Diagnosis present

## 2016-07-18 DIAGNOSIS — Z8041 Family history of malignant neoplasm of ovary: Secondary | ICD-10-CM | POA: Diagnosis not present

## 2016-07-18 DIAGNOSIS — G4733 Obstructive sleep apnea (adult) (pediatric): Secondary | ICD-10-CM | POA: Insufficient documentation

## 2016-07-18 DIAGNOSIS — Z9889 Other specified postprocedural states: Secondary | ICD-10-CM | POA: Diagnosis not present

## 2016-07-18 DIAGNOSIS — K219 Gastro-esophageal reflux disease without esophagitis: Secondary | ICD-10-CM | POA: Insufficient documentation

## 2016-07-18 DIAGNOSIS — Z6837 Body mass index (BMI) 37.0-37.9, adult: Secondary | ICD-10-CM | POA: Diagnosis not present

## 2016-07-18 HISTORY — DX: Personal history of other diseases of the digestive system: Z87.19

## 2016-07-18 HISTORY — PX: HOLMIUM LASER APPLICATION: SHX5852

## 2016-07-18 HISTORY — DX: Obstructive sleep apnea (adult) (pediatric): G47.33

## 2016-07-18 HISTORY — PX: CYSTOSCOPY WITH RETROGRADE PYELOGRAM, URETEROSCOPY AND STENT PLACEMENT: SHX5789

## 2016-07-18 HISTORY — DX: Calculus of ureter: N20.1

## 2016-07-18 SURGERY — CYSTOURETEROSCOPY, WITH RETROGRADE PYELOGRAM AND STENT INSERTION
Anesthesia: General | Laterality: Bilateral

## 2016-07-18 MED ORDER — ACETAMINOPHEN 10 MG/ML IV SOLN
INTRAVENOUS | Status: AC
  Filled 2016-07-18: qty 100

## 2016-07-18 MED ORDER — METOCLOPRAMIDE HCL 5 MG/ML IJ SOLN
INTRAMUSCULAR | Status: AC
  Filled 2016-07-18: qty 2

## 2016-07-18 MED ORDER — GENTAMICIN SULFATE 40 MG/ML IJ SOLN
5.0000 mg/kg | Freq: Once | INTRAVENOUS | Status: DC
  Filled 2016-07-18: qty 15

## 2016-07-18 MED ORDER — KETOROLAC TROMETHAMINE 30 MG/ML IJ SOLN
INTRAMUSCULAR | Status: AC
  Filled 2016-07-18: qty 1

## 2016-07-18 MED ORDER — KETOROLAC TROMETHAMINE 10 MG PO TABS
10.0000 mg | ORAL_TABLET | Freq: Four times a day (QID) | ORAL | 1 refills | Status: AC | PRN
Start: 2016-07-18 — End: ?

## 2016-07-18 MED ORDER — FENTANYL CITRATE (PF) 100 MCG/2ML IJ SOLN
INTRAMUSCULAR | Status: AC
  Filled 2016-07-18: qty 2

## 2016-07-18 MED ORDER — ONDANSETRON HCL 4 MG/2ML IJ SOLN
INTRAMUSCULAR | Status: AC
  Filled 2016-07-18: qty 2

## 2016-07-18 MED ORDER — CEPHALEXIN 500 MG PO CAPS: 500.0000 mg | ORAL_CAPSULE | Freq: Two times a day (BID) | ORAL | 0 refills | Status: AC

## 2016-07-18 MED ORDER — HYDROMORPHONE HCL 1 MG/ML IJ SOLN
0.2500 mg | INTRAMUSCULAR | Status: DC | PRN
  Filled 2016-07-18: qty 0.5

## 2016-07-18 MED ORDER — OXYCODONE-ACETAMINOPHEN 5-325 MG PO TABS: 1.0000 | ORAL_TABLET | Freq: Four times a day (QID) | ORAL | 0 refills | Status: AC | PRN

## 2016-07-18 MED ORDER — MIDAZOLAM HCL 2 MG/2ML IJ SOLN
INTRAMUSCULAR | Status: AC
  Filled 2016-07-18: qty 2

## 2016-07-18 MED ORDER — FENTANYL CITRATE (PF) 100 MCG/2ML IJ SOLN
INTRAMUSCULAR | Status: DC | PRN
  Administered 2016-07-18 (×4): 25 ug via INTRAVENOUS
  Administered 2016-07-18: 12.5 ug via INTRAVENOUS
  Administered 2016-07-18 (×2): 25 ug via INTRAVENOUS
  Administered 2016-07-18: 12.5 ug via INTRAVENOUS
  Administered 2016-07-18: 25 ug via INTRAVENOUS

## 2016-07-18 MED ORDER — MIDAZOLAM HCL 5 MG/5ML IJ SOLN
INTRAMUSCULAR | Status: DC | PRN
  Administered 2016-07-18: 2 mg via INTRAVENOUS

## 2016-07-18 MED ORDER — LIDOCAINE 2% (20 MG/ML) 5 ML SYRINGE
INTRAMUSCULAR | Status: AC
  Filled 2016-07-18: qty 5

## 2016-07-18 MED ORDER — PROPOFOL 10 MG/ML IV BOLUS
INTRAVENOUS | Status: AC
  Filled 2016-07-18: qty 20

## 2016-07-18 MED ORDER — METOCLOPRAMIDE HCL 5 MG/ML IJ SOLN
INTRAMUSCULAR | Status: DC | PRN
  Administered 2016-07-18: 10 mg via INTRAVENOUS

## 2016-07-18 MED ORDER — PROPOFOL 10 MG/ML IV BOLUS
INTRAVENOUS | Status: DC | PRN
  Administered 2016-07-18: 50 mg via INTRAVENOUS
  Administered 2016-07-18: 200 mg via INTRAVENOUS

## 2016-07-18 MED ORDER — MEPERIDINE HCL 25 MG/ML IJ SOLN
6.2500 mg | INTRAMUSCULAR | Status: DC | PRN
  Filled 2016-07-18: qty 1

## 2016-07-18 MED ORDER — KETOROLAC TROMETHAMINE 30 MG/ML IJ SOLN
INTRAMUSCULAR | Status: DC | PRN
  Administered 2016-07-18: 30 mg via INTRAVENOUS

## 2016-07-18 MED ORDER — ACETAMINOPHEN 10 MG/ML IV SOLN
INTRAVENOUS | Status: DC | PRN
  Administered 2016-07-18: 1000 mg via INTRAVENOUS

## 2016-07-18 MED ORDER — LACTATED RINGERS IV SOLN
INTRAVENOUS | Status: DC
  Administered 2016-07-18 (×3): via INTRAVENOUS
  Filled 2016-07-18: qty 1000

## 2016-07-18 MED ORDER — LIDOCAINE 2% (20 MG/ML) 5 ML SYRINGE
INTRAMUSCULAR | Status: DC | PRN
  Administered 2016-07-18: 100 mg via INTRAVENOUS

## 2016-07-18 MED ORDER — ONDANSETRON HCL 4 MG/2ML IJ SOLN
INTRAMUSCULAR | Status: DC | PRN
  Administered 2016-07-18: 4 mg via INTRAVENOUS

## 2016-07-18 MED ORDER — GENTAMICIN SULFATE 40 MG/ML IJ SOLN
5.0000 mg/kg | Freq: Once | INTRAVENOUS | Status: AC
  Administered 2016-07-18: 460 mg via INTRAVENOUS
  Filled 2016-07-18: qty 11.5

## 2016-07-18 MED ORDER — DEXAMETHASONE SODIUM PHOSPHATE 10 MG/ML IJ SOLN
INTRAMUSCULAR | Status: AC
  Filled 2016-07-18: qty 1

## 2016-07-18 MED ORDER — SENNOSIDES-DOCUSATE SODIUM 8.6-50 MG PO TABS: 1.0000 | ORAL_TABLET | Freq: Two times a day (BID) | ORAL | 0 refills | Status: AC

## 2016-07-18 MED ORDER — ONDANSETRON HCL 4 MG/2ML IJ SOLN
4.0000 mg | Freq: Once | INTRAMUSCULAR | Status: DC | PRN
Start: 2016-07-18 — End: 2016-07-18
  Filled 2016-07-18: qty 2

## 2016-07-18 MED ORDER — DEXAMETHASONE SODIUM PHOSPHATE 4 MG/ML IJ SOLN
INTRAMUSCULAR | Status: DC | PRN
  Administered 2016-07-18: 10 mg via INTRAVENOUS

## 2016-07-18 MED ORDER — IOHEXOL 300 MG/ML  SOLN
INTRAMUSCULAR | Status: DC | PRN
  Administered 2016-07-18: 20 mL

## 2016-07-18 SURGICAL SUPPLY — 33 items
BAG DRAIN URO-CYSTO SKYTR STRL (DRAIN) ×3 IMPLANT
BAG DRN UROCATH (DRAIN) ×1
BASKET LASER NITINOL 1.9FR (BASKET) ×5 IMPLANT
BSKT STON RTRVL 120 1.9FR (BASKET) ×2
CATH INTERMIT  6FR 70CM (CATHETERS) IMPLANT
CLOTH BEACON ORANGE TIMEOUT ST (SAFETY) ×4 IMPLANT
FIBER LASER FLEXIVA 365 (UROLOGICAL SUPPLIES) IMPLANT
FIBER LASER TRAC TIP (UROLOGICAL SUPPLIES) ×2 IMPLANT
GLOVE BIO SURGEON STRL SZ 6.5 (GLOVE) ×1 IMPLANT
GLOVE BIO SURGEON STRL SZ7.5 (GLOVE) ×3 IMPLANT
GLOVE BIO SURGEONS STRL SZ 6.5 (GLOVE) ×1
GLOVE BIOGEL PI IND STRL 6.5 (GLOVE) IMPLANT
GLOVE BIOGEL PI IND STRL 7.5 (GLOVE) IMPLANT
GLOVE BIOGEL PI INDICATOR 6.5 (GLOVE) ×2
GLOVE BIOGEL PI INDICATOR 7.5 (GLOVE) ×4
GOWN STRL REUS W/TWL LRG LVL3 (GOWN DISPOSABLE) ×7 IMPLANT
GOWN STRL REUS W/TWL XL LVL3 (GOWN DISPOSABLE) IMPLANT
GUIDEWIRE ANG ZIPWIRE 038X150 (WIRE) ×5 IMPLANT
GUIDEWIRE STR DUAL SENSOR (WIRE) ×5 IMPLANT
INFUSOR MANOMETER BAG 3000ML (MISCELLANEOUS) ×5 IMPLANT
IV NS 1000ML (IV SOLUTION) ×3
IV NS 1000ML BAXH (IV SOLUTION) ×1 IMPLANT
IV NS IRRIG 3000ML ARTHROMATIC (IV SOLUTION) ×5 IMPLANT
KIT RM TURNOVER CYSTO AR (KITS) ×3 IMPLANT
MANIFOLD NEPTUNE II (INSTRUMENTS) ×2 IMPLANT
NS IRRIG 1000ML POUR BTL (IV SOLUTION) ×2 IMPLANT
NS IRRIG 500ML POUR BTL (IV SOLUTION) ×3 IMPLANT
PACK CYSTO (CUSTOM PROCEDURE TRAY) ×3 IMPLANT
STENT POLARIS 5FRX26 (STENTS) ×4 IMPLANT
SYRINGE 10CC LL (SYRINGE) ×3 IMPLANT
TUBE CONNECTING 12'X1/4 (SUCTIONS)
TUBE CONNECTING 12X1/4 (SUCTIONS) IMPLANT
TUBE FEEDING 8FR 16IN STR KANG (MISCELLANEOUS) ×3 IMPLANT

## 2016-07-18 NOTE — H&P (Signed)
Bruce Stevenson is an 46 y.o. male.    Chief Complaint: Pre-op 1st stage BILATERAL Ureteroscopic Stone Manipulation  HPI:   1 - Recurrent Nephrolithiasis -  Pre 2018 SWL, URS, MET x many  07/2016 - left 1cm + mid ureteral steinstrasse + bilateral renal stone by KUB.   PMH sig for benign back surgery (no deficits), obesity. NO CV disease / blood thinners. No PCP at present.   Today "Bruce Stevenson" is seen to proceed with 1st stage bilateral ureteroscopic stone manipulation. Most recent UCX negative. No interval fevers.    Past Medical History:  Diagnosis Date  . Anxiety   . Depression   . GERD (gastroesophageal reflux disease)   . Headache(784.0)   . History of gastritis   . Left ureteral stone   . Nephrolithiasis    bilateral  . OSA (obstructive sleep apnea)    moderate OSA per study 04-14-2008-- pt does use cpap  . Osteoarthritis     Past Surgical History:  Procedure Laterality Date  . ESOPHAGOGASTRODUODENOSCOPY  05/15/2010  . LAMINECTOMY N/A 06/01/2013   Procedure: Lumbar Two to Three Laminectomy for resection of spinal tumor with operating microscope;  Surgeon: Kristeen Miss, MD;  Location: [place] [facility];  Service: Neurosurgery;  Laterality: N/A;  L2-3 Laminectomy for resection of spinal tumor with operating microscope  . STRABISMUS SURGERY Left age 48    Family History  Problem Relation Age of Onset  . Ovarian cancer Mother   . Diabetes Mother   . Colon polyps Father 76   Social History:  reports that he has never smoked. He has never used smokeless tobacco. He reports that he does not drink alcohol or use drugs.  Allergies: No Known Allergies  No prescriptions prior to admission.    No results found for this or any previous visit (from the past 48 hour(s)). No results found.  Review of Systems  Constitutional: Negative.   HENT: Negative.   Eyes: Negative.   Respiratory: Negative.   Cardiovascular: Negative.   Gastrointestinal: Negative.   Genitourinary: Positive  for flank pain.  Skin: Negative.   Neurological: Negative.   Endo/Heme/Allergies: Negative.   Psychiatric/Behavioral: Negative.     Height _0  (1.778 m), weight 119.7 kg (264 lb). Physical Exam  Constitutional: He appears well-developed.  HENT:  Head: Normocephalic.  Eyes: Pupils are equal, round, and reactive to light.  Neck: Normal range of motion.  Cardiovascular: Normal rate.   Respiratory: Effort normal.  GI: Soft.  Genitourinary:  Genitourinary Comments: Mild LEFT CVAT  Musculoskeletal: Normal range of motion.  Neurological: He is alert.  Skin: Skin is warm.  Psychiatric: He has a normal mood and affect. His behavior is normal.     Assessment/Plan  1 - Recurrent Nephrolithiasis - proceed as planned with 1st stage BILATERAL ureteroscopic stone manipulation. Focus today left ureteral stone, then moreso bilateral renal stones next stage with interval bilateral stents. Risks, benefits, alternatives, expected peri-op course and need for staged approach discussed previously and reiterated today.    Alexis Frock, MD 07/18/2016, 6:15 AM

## 2016-07-18 NOTE — Transfer of Care (Signed)
Immediate Anesthesia Transfer of Care Note  Patient: Bruce Stevenson  Procedure(s) Performed: Procedure(s) (LRB): 1ST STAGE -CYSTOSCOPY WITH RETROGRADE PYELOGRAM, URETEROSCOPY AND STENT PLACEMENT (Bilateral) HOLMIUM LASER APPLICATION (Bilateral)  Patient Location: PACU  Anesthesia Type: General  Level of Consciousness: awake, sedated, patient cooperative and responds to stimulation  Airway & Oxygen Therapy: Patient Spontanous Breathing and Patient connected to [place] O2  Post-op Assessment: Report given to PACU RN, Post -op Vital signs reviewed and stable and Patient moving all extremities  Post vital signs: Reviewed and stable  Complications: No apparent anesthesia complications

## 2016-07-18 NOTE — Anesthesia Postprocedure Evaluation (Signed)
Anesthesia Post Note  Patient: Bruce Stevenson  Procedure(s) Performed: Procedure(s) (LRB): 1ST STAGE -CYSTOSCOPY WITH RETROGRADE PYELOGRAM, URETEROSCOPY AND STENT PLACEMENT (Bilateral) HOLMIUM LASER APPLICATION (Bilateral)     Patient location during evaluation: PACU Anesthesia Type: General Level of consciousness: awake and alert Pain management: pain level controlled Vital Signs Assessment: post-procedure vital signs reviewed and stable Respiratory status: spontaneous breathing, nonlabored ventilation, respiratory function stable and patient connected to nasal cannula oxygen Cardiovascular status: blood pressure returned to baseline and stable Postop Assessment: no signs of nausea or vomiting Anesthetic complications: no    Last Vitals:  Vitals:   07/18/16 1330 07/18/16 1345  BP: 135/79 124/74  Pulse: 82 76  Resp: 13 11  Temp:      Last Pain:  Vitals:   07/18/16 1324  TempSrc:   PainSc: [place] DAVID

## 2016-07-18 NOTE — Interval H&P Note (Signed)
History and Physical Interval Note:  07/18/2016 11:26 AM  Bruce Stevenson  has presented today for surgery, with the diagnosis of BILATERAL RENAL AND LEFT URETERAL STONE  The various methods of treatment have been discussed with the patient and family. After consideration of risks, benefits and other options for treatment, the patient has consented to  Procedure(s): 1ST STAGE -CYSTOSCOPY WITH RETROGRADE PYELOGRAM, URETEROSCOPY AND STENT PLACEMENT (Bilateral) HOLMIUM LASER APPLICATION (Bilateral) as a surgical intervention .  The patient's history has been reviewed, patient examined, no change in status, stable for surgery.  I have reviewed the patient's chart and labs.  Questions were answered to the patient's satisfaction.     [name]

## 2016-07-18 NOTE — Anesthesia Preprocedure Evaluation (Signed)
Anesthesia Evaluation  Patient identified by MRN, date of birth, ID band Patient awake    Reviewed: Allergy & Precautions, NPO status , Patient's Chart, lab work & pertinent test results  Airway Mallampati: I  TM Distance: >3 FB Neck ROM: Full    Dental   Pulmonary sleep apnea ,    Pulmonary exam normal        Cardiovascular Normal cardiovascular exam     Neuro/Psych    GI/Hepatic   Endo/Other    Renal/GU Renal InsufficiencyRenal disease     Musculoskeletal   Abdominal   Peds  Hematology   Anesthesia Other Findings   Reproductive/Obstetrics                             Anesthesia Physical Anesthesia Plan  ASA: III  Anesthesia Plan: General   Post-op Pain Management:    Induction: Intravenous  PONV Risk Score and Plan: 2 and Ondansetron and Treatment may vary due to age or medical condition  Airway Management Planned: LMA  Additional Equipment:   Intra-op Plan:   Post-operative Plan: Extubation in OR  Informed Consent: I have reviewed the patients History and Physical, chart, labs and discussed the procedure including the risks, benefits and alternatives for the proposed anesthesia with the patient or authorized representative who has indicated his/her understanding and acceptance.     Plan Discussed with: CRNA and Surgeon  Anesthesia Plan Comments:         Anesthesia Quick Evaluation

## 2016-07-18 NOTE — Interval H&P Note (Signed)
History and Physical Interval Note:  07/18/2016 11:26 AM  Bruce Stevenson  has presented today for surgery, with the diagnosis of BILATERAL RENAL AND LEFT URETERAL STONE  The various methods of treatment have been discussed with the patient and family. After consideration of risks, benefits and other options for treatment, the patient has consented to  Procedure(s): 1ST STAGE -CYSTOSCOPY WITH RETROGRADE PYELOGRAM, URETEROSCOPY AND STENT PLACEMENT (Bilateral) HOLMIUM LASER APPLICATION (Bilateral) as a surgical intervention .  The patient's history has been reviewed, patient examined, no change in status, stable for surgery.  I have reviewed the patient's chart and labs.  Questions were answered to the patient's satisfaction.     [name]

## 2016-07-18 NOTE — Discharge Instructions (Signed)
Post Anesthesia Home Care Instructions  Activity: Get plenty of rest for the remainder of the day. A responsible individual must stay with you for 24 hours following the procedure.  For the next 24 hours, DO NOT: -Drive a car -Operate machinery -Drink alcoholic beverages -Take any medication unless instructed by your physician -Make any legal decisions or sign important papers.  Meals: Start with liquid foods such as gelatin or soup. Progress to regular foods as tolerated. Avoid greasy, spicy, heavy foods. If nausea and/or vomiting occur, drink only clear liquids until the nausea and/or vomiting subsides. Call your physician if vomiting continues.  Special Instructions/Symptoms: Your throat may feel dry or sore from the anesthesia or the breathing tube placed in your throat during surgery. If this causes discomfort, gargle with warm salt water. The discomfort should disappear within 24 hours.  If you had a scopolamine patch placed behind your ear for the management of post- operative nausea and/or vomiting:  1. The medication in the patch is effective for 72 hours, after which it should be removed.  Wrap patch in a tissue and discard in the trash. Wash hands thoroughly with soap and water. 2. You may remove the patch earlier than 72 hours if you experience unpleasant side effects which may include dry mouth, dizziness or visual disturbances. 3. Avoid touching the patch. Wash your hands with soap and water after contact with the patch.      [facility] [phone] Post Ureteroscopy With or Without Stent Instructions  Definitions:  Ureter: The duct that transports urine from the kidney to the bladder. Stent:   A plastic hollow tube that is placed into the ureter, from the kidney to the bladder to prevent the ureter from swelling shut.  GENERAL INSTRUCTIONS:  Despite the fact that no skin incisions were used, the area around the ureter and bladder is raw and  irritated. The stent is a foreign body which will further irritate the bladder wall. This irritation is manifested by increased frequency of urination, both day and night, and by an increase in the urge to urinate. In some, the urge to urinate is present almost always. Sometimes the urge is strong enough that you may not be able to stop yourself from urinating. The only real cure is to remove the stent and then give time for the bladder wall to heal which can't be done until the danger of the ureter swelling shut has passed, which varies.  You may see some blood in your urine while the stent is in place and a few days afterwards. Do not be alarmed, even if the urine was clear for a while. Get off your feet and drink lots of fluids until clearing occurs. If you start to pass clots or don't improve, call us.  DIET: You may return to your normal diet immediately. Because of the raw surface of your bladder, alcohol, spicy foods, acid type foods and drinks with caffeine may cause irritation or frequency and should be used in moderation. To keep your urine flowing freely and to avoid constipation, drink plenty of fluids during the day ( 8-10 glasses ). Tip: Avoid cranberry juice because it is very acidic.  ACTIVITY: Your physical activity doesn't need to be restricted. However, if you are very active, you may see some blood in your urine. We suggest that you reduce your activity under these circumstances until the bleeding has stopped.  BOWELS: It is important to keep your bowels regular during the postoperative period. Straining   It is important to keep your bowels regular during the postoperative period. Straining with bowel movements can cause bleeding. A bowel movement every other day is reasonable. Use a mild laxative if needed, such as Milk of Magnesia 2-3 tablespoons, or 2 Dulcolax tablets. Call if you continue to have problems. If you have been taking narcotics for pain, before, during or after your surgery, you may be constipated. Take a laxative if  necessary. ° ° °MEDICATION: °You should resume your pre-surgery medications unless told not to. In addition you will often be given an antibiotic to prevent infection. These should be taken as prescribed until the bottles are finished unless you are having an unusual reaction to one of the drugs. ° °PROBLEMS YOU SHOULD REPORT TO US: °· Fevers over 100.5 Fahrenheit. °· Heavy bleeding, or clots ( See above notes about blood in urine ). °· Inability to urinate. °· Drug reactions ( hives, rash, nausea, vomiting, diarrhea ). °· Severe burning or pain with urination that is not improving. ° °FOLLOW-UP: °You will need a follow-up appointment to monitor your progress. Call for this appointment at the number listed above. Usually the first appointment will be about three to fourteen days after your surgery. ° ° ° ° °1 - You may have urinary urgency (bladder spasms) and bloody urine on / off with stent in place. This is normal. ° °2 - Call MD or go to ER for fever >102, severe pain / nausea / vomiting not relieved by medications, or acute change in medical status ° °

## 2016-07-18 NOTE — Anesthesia Procedure Notes (Signed)
Procedure Name: LMA Insertion Date/Time: 07/18/2016 11:54 AM Performed by: Justice Rocher Pre-anesthesia Checklist: Patient identified, Emergency Drugs available, Suction available and Patient being monitored Patient Re-evaluated:Patient Re-evaluated prior to induction Oxygen Delivery Method: Circle system utilized Preoxygenation: Pre-oxygenation with 100% oxygen Induction Type: IV induction Ventilation: Mask ventilation without difficulty LMA: LMA inserted LMA Size: 5.0 Number of attempts: 1 Airway Equipment and Method: Bite block Placement Confirmation: positive ETCO2 and breath sounds checked- equal and bilateral Tube secured with: Tape Dental Injury: Teeth and Oropharynx as per pre-operative assessment

## 2016-07-18 NOTE — Brief Op Note (Signed)
07/18/2016  1:10 PM  PATIENT:  Bruce Stevenson  46 y.o. male  PRE-OPERATIVE DIAGNOSIS:  BILATERAL RENAL AND LEFT URETERAL STONE  POST-OPERATIVE DIAGNOSIS:  BILATERAL RENAL AND LEFT URETERAL STONE  PROCEDURE:  Procedure(s): 1ST STAGE -CYSTOSCOPY WITH RETROGRADE PYELOGRAM, URETEROSCOPY AND STENT PLACEMENT (Bilateral) HOLMIUM LASER APPLICATION (Bilateral)  SURGEON:  Surgeon(s) and Role:    Alexis Frock, MD - Primary  PHYSICIAN ASSISTANT:   ASSISTANTS: none   ANESTHESIA:   general  EBL:  Total I/O In: 200 [I.V.:200] Out: 0   BLOOD ADMINISTERED:none  DRAINS: none   LOCAL MEDICATIONS USED:  NONE  SPECIMEN:  Source of Specimen:  Bilateral ureteral / renal stone fragments  DISPOSITION OF SPECIMEN:  [facility] for compositional analysis  COUNTS:  YES  TOURNIQUET:  * No tourniquets in log *  DICTATION: .Other Dictation: Dictation Number [id number]  PLAN OF CARE: Discharge to home after PACU  PATIENT DISPOSITION:  PACU - hemodynamically stable.   Delay start of Pharmacological VTE agent (>24hrs) due to surgical blood loss or risk of bleeding: yes

## 2016-07-19 ENCOUNTER — Encounter (HOSPITAL_BASED_OUTPATIENT_CLINIC_OR_DEPARTMENT_OTHER): Payer: Self-pay | Admitting: Urology

## 2016-07-19 NOTE — Op Note (Signed)
NAME:  Gonnella, Lemarcus                     ACCOUNT NO.:  MEDICAL RECORD NO.:  [id number]  LOCATION:                                 FACILITY:  PHYSICIAN:  Alexis Frock, MD          DATE OF BIRTH:  DATE OF PROCEDURE: 07/18/2016                               OPERATIVE REPORT   PREOPERATIVE DIAGNOSIS:  Left greater than right bilateral nephrolithiasis.  PROCEDURE: 1. Cystoscopy with bilateral retrograde pyelogram and interpretation. 2. Bilateral ureteroscopy with lithotripsy. 3. Insertion of bilateral ureteral stents, 5 x 26 Polaris, no tether.  ESTIMATED BLOOD LOSS:  Nil.  COMPLICATION:  None.  SPECIMEN:  Left ureteral and bilateral renal stone fragments for compositional analysis.  FINDINGS: 1. Large volume left mid ureteral stone. 2. Moderate volume bilateral intrarenal stone. 3. Complete resolution of all stone fragments larger than one-third     millimeter following laser lithotripsy and basket extraction     bilaterally. 4. Successful placement of bilateral ureteral stents, proximal in     renal pelvis and distal in the urinary bladder bilaterally.  INDICATION:  Mr. Bunn is very pleasant 46 year old gentleman with history of recurrent nephrolithiasis.  He is on workup of colicky flank pain to have multifocal large volume left ureteral stone and smaller volume bilateral intrarenal stones.  Total volume was nearly 2 cm2. Options were discussed with the patient including shockwave lithotripsy of dominant ureteral stone versus ureteroscopy, unilateral versus bilateral ureteroscopy in a staged fashion with goal of stone free and he wished to proceed with the latter.  Informed consent was obtained and placed in the medical record for first-staged bilateral ureteroscopic stone manipulation today.  PROCEDURE IN DETAIL:  The patient being, Celvin Taney, was verified. Procedure being, bilateral ureteroscopic stone manipulation, was confirmed.  Procedure was carried out.   Time-out was performed. Intravenous antibiotics were administered.  General anesthesia introduced.  The patient was placed into a low lithotomy position. Sterile field was created by prepping and draping the patient's penis, perineum, proximal thighs using iodine.  Next, cystourethroscopy was performed using a rigid cystoscope with offset lens.  Inspection of the anterior and posterior urethra unremarkable.  Next, the right ureteral orifice was cannulated with a 6-French end-hole catheter and right retrograde pyelogram was obtained.  Right retrograde pyelogram demonstrated a single right ureter with single-system right kidney.  No filling defects or narrowing noted. There were some calcifications in the cystic area of the kidney consistent with known papillary tip calcifications.  A 0.038 ZIPwire was advanced to the level of the upper pole, set aside as a safety wire. Similarly, left retrograde pyelogram was obtained.  Left retrograde pyelogram demonstrated a single left ureter with single- system left kidney.  There were multifocal filling defects in the mid ureter that were mobile with proximal hydroureteronephrosis consistent with known mid ureteral stone.  A 0.038 ZIPwire was advanced to the level of lower pole set aside as a safety wire.  An 8-French feeding tube was placed in urinary bladder for pressure release.  Next, semi-rigid ureteroscopy was = performed in the distal four-fifths of the right ureter alongside a separate Sensor working wire.  No mucosal abnormalities were found.  Similarly, semi-rigid ureteroscopy was performed.  The distal left ureter alongside a separate Sensor working wire.  As expected, there were multifocal intraureteral stones at the level of the mid ureter.  These were quite mobile and were flushed retrograde up to the area of the kidney.  As such, the semi- rigid scope was exchanged for a 12/14, 35 cm ureteral access sheath at the level of  proximal ureter using continuous fluoroscopic guidance and flexible digital ureteroscopy was performed of the proximal left ureter and systematic inspection of left kidney.  The retrograde positioned ureteral stones were then located in the renal pelvis.  These stones as well as several intrarenal stones appeared to be too large for simple basketing.  As such, holmium laser energy applied to the stone using settings of 0.3 joules and 30 hertz using a dusting technique and approximately 50% of the stone volume was ablated with this.  The remainder of stone volume fragmented into pieces approximately 1-2 mm. These were then sequentially grasped with an Escape basket along axis, removed and set aside for compositional analysis.  Following these maneuvers, complete resolution of all stone fragments larger than one third millimeter on the left side, the access sheath was removed under continuous vision.  No mucosal abnormalities were seen on the left ureter.  As we were progressing quite well at this point, the access sheath was advanced over the right sensor working wire to the level of the proximal right ureter and systematic inspection performed of the proximal right ureter and right kidney using a flexible digital ureteroscope.  There were multifocal papillary tip calcifications. Several small volume intrarenal stones in upper pole noted as well. Several of the upper pole stones appeared to be too large for simple basketing and Holmium laser energy was applied to the stones using the aforementioned settings.  An Escape basket was then used to remove all fragments on the right side such that the right side was free of all stones larger than one third millimeter.  The access sheath was removed under continuous vision.  No mucosal abnormalities were found.  Given the bilateral nature of the procedure today and large volume stone, it was felt that interval stenting with non tether would be  warranted; however, it was felt that the patient would not require a second stage procedure, which is quite fortunate.  As such, a new 5 x 26 Polaris-type stent was placed over the right safety wire using cystoscopic and fluoroscopic guidance.  Good proximal and distal deployment were noted. Similarly, a separate 5 x 26 Polaris stent was placed on the left safety wire using cystoscopic and fluoroscopic guidance.  Good proximal and distal deployment were noted.  Bladder was emptied per cystoscope. Procedure was then terminated.  The patient tolerated procedure well. There were no immediate periprocedural complications.  The patient was taken to the postanesthesia care unit in stable condition.         ______________________________ Alexis Frock, MD    TM/MEDQ  D:  07/18/2016  T:  07/18/2016  Job:  [id number]

## 2016-08-01 ENCOUNTER — Ambulatory Visit (HOSPITAL_BASED_OUTPATIENT_CLINIC_OR_DEPARTMENT_OTHER): Admission: RE | Admit: 2016-08-01 | Payer: 59 | Source: Ambulatory Visit | Admitting: Urology

## 2016-08-01 ENCOUNTER — Encounter (HOSPITAL_BASED_OUTPATIENT_CLINIC_OR_DEPARTMENT_OTHER): Admission: RE | Payer: Self-pay | Source: Ambulatory Visit

## 2016-08-01 SURGERY — CYSTOURETEROSCOPY, WITH RETROGRADE PYELOGRAM AND STENT INSERTION
Anesthesia: General | Laterality: Bilateral

## 2018-04-29 IMAGING — DX DG ABDOMEN 1V
2 series · 2 of 2 positions shown · non-contrast
Comparison: 06/25/2016 [HOSPITAL] KUB, and
earlier.

CLINICAL DATA: 45-year-old male with 1 week of progressive left
flank pain.

EXAM:
ABDOMEN - 1 VIEW

[abdomen kub (1 of 2)]
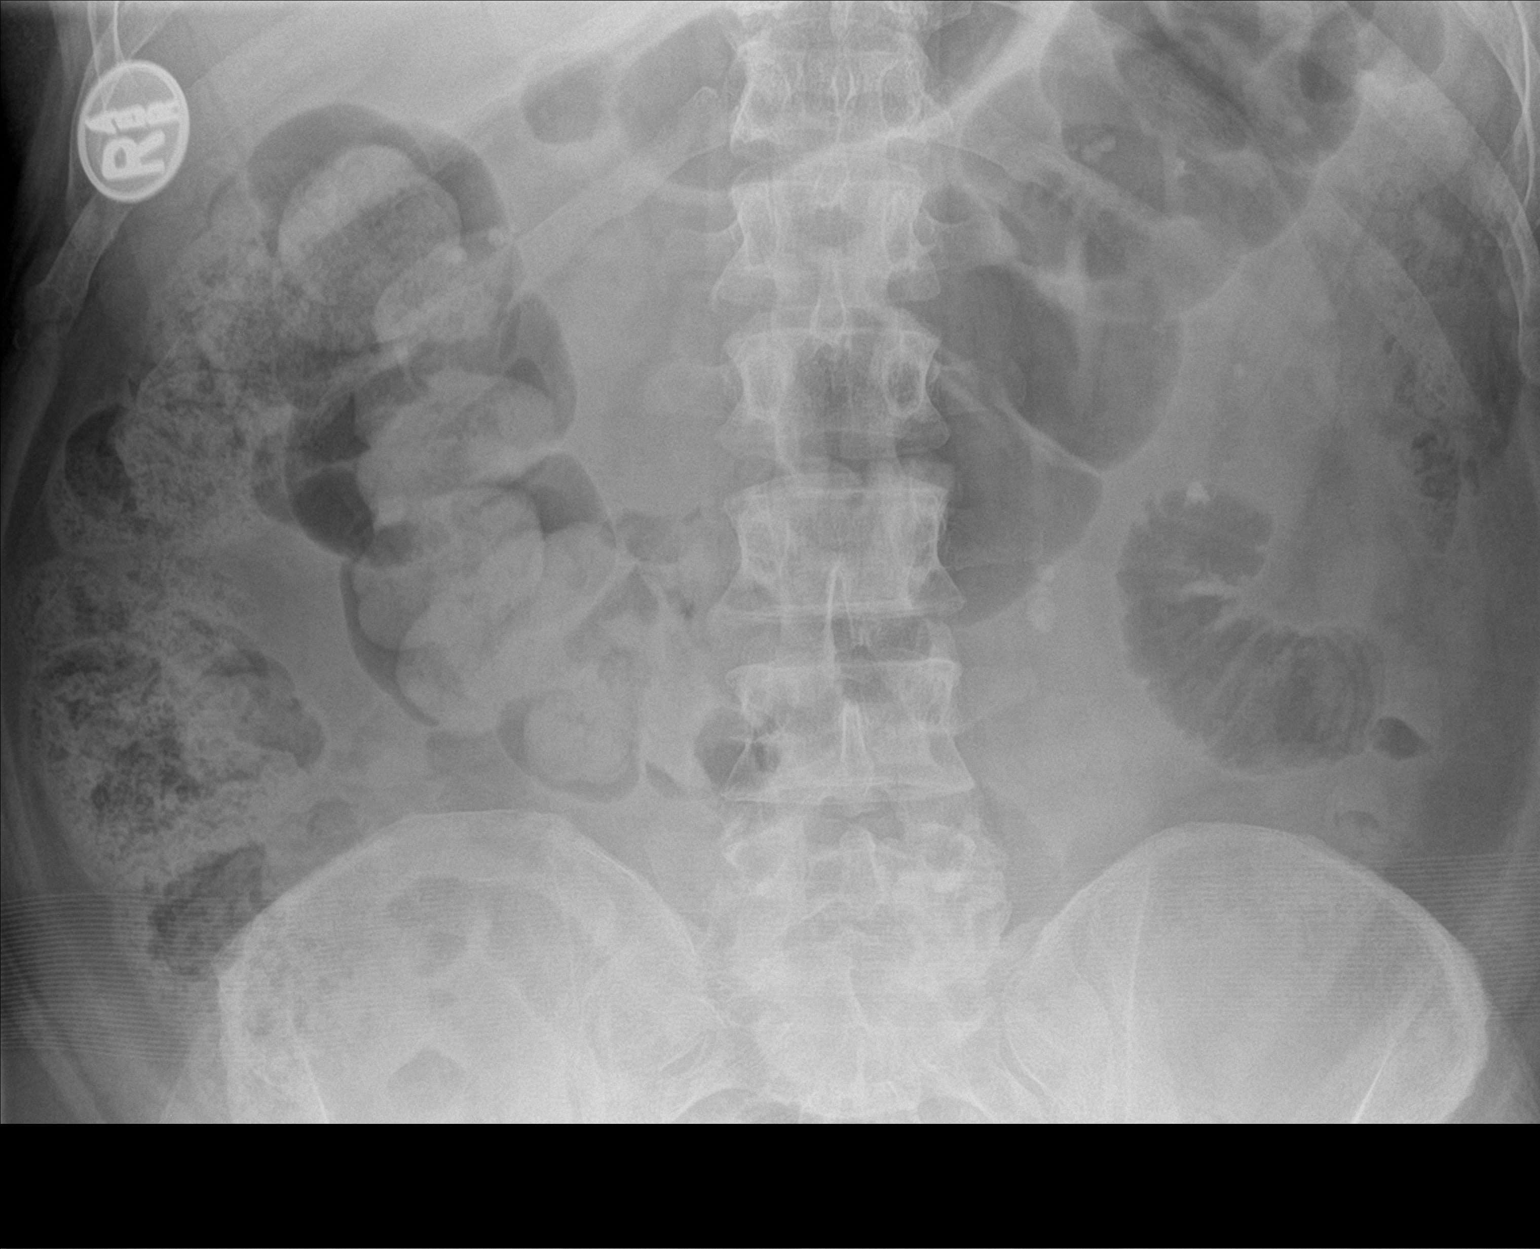

[abdomen kub (2 of 2)]
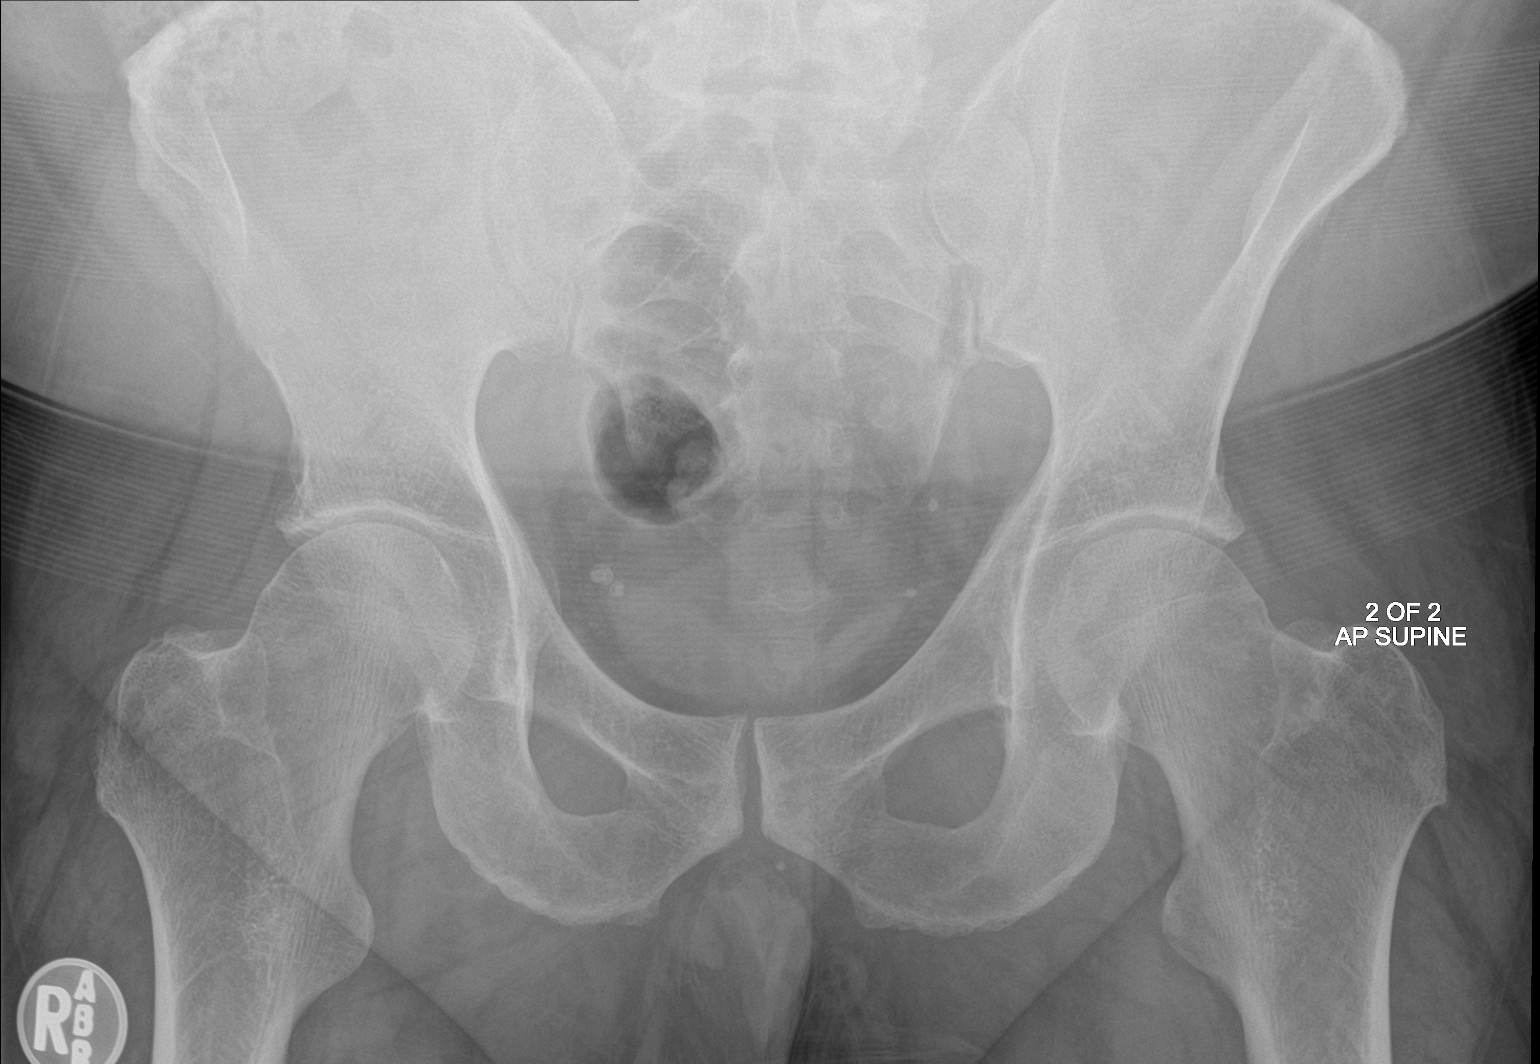

[2 of 2 positions shown; findings below may reference images not displayed]

FINDINGS: A cluster of stones together measuring up to 20 mm projects at at
the level of the proximal left ureter, at the L3-L4 level. These are
slightly distal to their position on 06/25/2016 (L3 at that time).

Numerous superimposed left nephrolithiasis which is otherwise
grossly stable since 1294. Similar multiple calculi projecting over
the right renal shadow today are partially obscured by proximal
transverse colon.

Stable bilateral pelvic phleboliths since 1294.

Visualized bowel gas pattern is non obstructed. No acute osseous
abnormality identified.
IMPRESSION: 1. Cluster of proximal left ureteral calculi, individually up to 10
mm, with minimal distal migration since 06/25/2016. Suspect
associated left side obstructive uropathy.
2. Extensive bilateral nephrolithiasis.

## 2022-09-13 ENCOUNTER — Other Ambulatory Visit: Payer: Self-pay | Admitting: Urology

## 2022-11-23 ENCOUNTER — Ambulatory Visit (HOSPITAL_BASED_OUTPATIENT_CLINIC_OR_DEPARTMENT_OTHER): Admit: 2022-11-23 | Payer: 59 | Admitting: Urology

## 2022-11-23 ENCOUNTER — Encounter (HOSPITAL_BASED_OUTPATIENT_CLINIC_OR_DEPARTMENT_OTHER): Payer: Self-pay

## 2022-11-23 SURGERY — VASECTOMY
Anesthesia: Monitor Anesthesia Care | Laterality: Bilateral
